# Patient Record
Sex: Female | Born: 2005 | Race: White | Hispanic: No | Marital: Single | Smoking: Never smoker
Health system: Southern US, Community
[De-identification: ages and names within clinical notes are randomized; demographics above are authoritative.]

## PROBLEM LIST (undated history)

## (undated) DIAGNOSIS — F419 Anxiety disorder, unspecified: Secondary | ICD-10-CM

## (undated) DIAGNOSIS — E282 Polycystic ovarian syndrome: Secondary | ICD-10-CM

## (undated) DIAGNOSIS — F32A Depression, unspecified: Secondary | ICD-10-CM

## (undated) DIAGNOSIS — M419 Scoliosis, unspecified: Secondary | ICD-10-CM

## (undated) DIAGNOSIS — K219 Gastro-esophageal reflux disease without esophagitis: Secondary | ICD-10-CM

## (undated) DIAGNOSIS — F909 Attention-deficit hyperactivity disorder, unspecified type: Secondary | ICD-10-CM

## (undated) HISTORY — DX: Polycystic ovarian syndrome: E28.2

---
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
## 2024-01-05 ENCOUNTER — Encounter (HOSPITAL_BASED_OUTPATIENT_CLINIC_OR_DEPARTMENT_OTHER): Payer: Self-pay

## 2024-01-05 ENCOUNTER — Other Ambulatory Visit: Payer: Self-pay

## 2024-01-05 ENCOUNTER — Emergency Department (HOSPITAL_BASED_OUTPATIENT_CLINIC_OR_DEPARTMENT_OTHER)

## 2024-01-05 ENCOUNTER — Emergency Department (HOSPITAL_BASED_OUTPATIENT_CLINIC_OR_DEPARTMENT_OTHER)
Admission: EM | Admit: 2024-01-05 | Discharge: 2024-01-05 | Disposition: A | Discharge status: Home / Self Care | Attending: Emergency Medicine | Admitting: Emergency Medicine

## 2024-01-05 DIAGNOSIS — R739 Hyperglycemia, unspecified: Secondary | ICD-10-CM | POA: Insufficient documentation

## 2024-01-05 DIAGNOSIS — R112 Nausea with vomiting, unspecified: Secondary | ICD-10-CM | POA: Insufficient documentation

## 2024-01-05 DIAGNOSIS — R1013 Epigastric pain: Secondary | ICD-10-CM

## 2024-01-05 LAB — COMPREHENSIVE METABOLIC PANEL WITH GFR
ALT: 13 U/L (ref 0–44)
AST: 20 U/L (ref 15–41)
Albumin: 4.6 g/dL (ref 3.5–5.0)
Alkaline Phosphatase: 134 U/L — ABNORMAL HIGH (ref 38–126)
Anion gap: 13 (ref 5–15)
BUN: 9 mg/dL (ref 6–20)
CO2: 24 mmol/L (ref 22–32)
Calcium: 10.6 mg/dL — ABNORMAL HIGH (ref 8.9–10.3)
Chloride: 102 mmol/L (ref 98–111)
Creatinine, Ser: 0.8 mg/dL (ref 0.44–1.00)
GFR, Estimated: >60 mL/min (ref >60)
Glucose, Bld: 92 mg/dL (ref 70–99)
Potassium: 4 mmol/L (ref 3.5–5.1)
Sodium: 139 mmol/L (ref 135–145)
Total Bilirubin: 0.4 mg/dL (ref 0.0–1.2)
Total Protein: 8.3 g/dL — ABNORMAL HIGH (ref 6.5–8.1)

## 2024-01-05 LAB — CBC
HCT: 44.5 % (ref 36.0–46.0)
Hemoglobin: 14.6 g/dL (ref 12.0–15.0)
MCH: 28.7 pg (ref 26.0–34.0)
MCHC: 32.8 g/dL (ref 30.0–36.0)
MCV: 87.4 fL (ref 80.0–100.0)
Platelets: 401 K/uL — ABNORMAL HIGH (ref 150–400)
RBC: 5.09 MIL/uL (ref 3.87–5.11)
RDW: 11.9 % (ref 11.5–15.5)
WBC: 9.7 K/uL (ref 4.0–10.5)
nRBC: 0 % (ref 0.0–0.2)

## 2024-01-05 LAB — URINALYSIS, ROUTINE W REFLEX MICROSCOPIC
Bilirubin Urine: NEGATIVE
Glucose, UA: NEGATIVE mg/dL
Hgb urine dipstick: NEGATIVE
Ketones, ur: NEGATIVE mg/dL
Nitrite: NEGATIVE
Protein, ur: NEGATIVE mg/dL
Specific Gravity, Urine: 1.005 (ref 1.005–1.030)
pH: 7 (ref 5.0–8.0)

## 2024-01-05 LAB — LIPASE, BLOOD: Lipase: 23 U/L (ref 11–51)

## 2024-01-05 LAB — PREGNANCY, URINE: Preg Test, Ur: NEGATIVE

## 2024-01-05 MED ORDER — IOHEXOL 300 MG/ML  SOLN
100.0000 mL | Freq: Once | INTRAMUSCULAR | Status: AC | PRN
  Administered 2024-01-05 (×2): 100 mL via INTRAVENOUS

## 2024-01-05 MED ORDER — PANTOPRAZOLE SODIUM 20 MG PO TBEC: 20.0000 mg | DELAYED_RELEASE_TABLET | Freq: Every day | ORAL | 1 refills | Status: DC

## 2024-01-05 MED ORDER — SUCRALFATE 1 G PO TABS: 1.0000 g | ORAL_TABLET | Freq: Three times a day (TID) | ORAL | 0 refills | Status: DC

## 2024-01-05 NOTE — Discharge Instructions (Signed)
 As discussed, your CT scan appeared normal.  Suspect your symptoms are likely coming from the stomach whether gastritis versus peptic ulcer disease.  Will treat your symptoms with PPI in the form of pantoprazole  as well as sucralfate  to use.  See lifestyle changes on your discharge instruction packet to help decrease likelihood of exacerbations.  Will send referral for GI to follow-up with.  Please not hesitate to return if the worrisome signs and symptoms discussed become apparent.

## 2024-01-05 NOTE — ED Triage Notes (Signed)
 Pt complaining of abdominal distention that started about 6 months ago. Recently she is only able to eat chicken broth. Hurts to wear underwear. Has been nauseated more and has vomited a couple of times.

## 2024-01-05 NOTE — ED Provider Notes (Addendum)
 [place] EMERGENCY DEPARTMENT AT [facility] Provider Note   CSN: [id number] Arrival date & time: 01/05/24  1417     Patient presents with: Abdominal Pain   Amy Cuevas is a 18 y.o. female.    Abdominal Pain   18 year old female presents emergency department with complaints of abdominal pain, nausea, vomiting, decreased bowel movements.  States that she has been dealing with symptoms intermittently for the past 6 months but symptoms have been more persistent over the past week or so.  Reports pain mainly in the upper middle abdomen that sometimes goes to her back and symptoms radiates to the rest of her abdomen.  States the pain mainly occurs when she eats, after she eats or when she lays down at night.  Has tried multiple over-the-counter reflux medications including Pepcid, omeprazole, Pepto-Bismol without significant improvement of symptoms.  Reports nausea and emesis when the pain is significant.  States that she typically has 1 bowel movement a week.  This has been the case for the past year or so with last bowel movement this morning.  Feels like her abdomen is swollen.  Denies any fevers, chills, urinary symptoms/melena, hematemesis.  Denies history abdominal surgeries.  Past medical history significant for PCOS, slow transit constipation, GAD, abnormal uterine bleeding Prior to Admission medications   Medication Sig Start Date End Date Taking? Authorizing Provider  escitalopram  (LEXAPRO ) 10 MG tablet Take 1.5 tablets (15 mg total) by mouth daily. 08/06/21   Viviana Aleck DASEN, FNP  hydrOXYzine (ATARAX/VISTARIL) 25 MG tablet Take 25 mg by mouth 3 (three) times daily as needed.    [provider]  lisdexamfetamine (VYVANSE ) 20 MG capsule Take 1 capsule (20 mg total) by mouth daily with breakfast. 11/05/21   Viviana Aleck DASEN, FNP  norelgestromin -ethinyl estradiol  (ZAFEMY ) 150-35 MCG/24HR transdermal patch PLACE 1 PATCH ONTO THE SKIN ONCE A WEEK 08/06/21   Viviana Aleck T, FNP  polyethylene glycol powder (GLYCOLAX /MIRALAX ) 17 GM/SCOOP powder Follow clean-out instructions and daily maintenance dose as discussed. 04/05/20   Joshua Bari HERO, NP  tretinoin  (RETIN-A ) 0.05 % cream Apply topically at bedtime. 10/15/21   Viviana Aleck DASEN, FNP  Vitamin D , Ergocalciferol , (DRISDOL ) 1.25 MG (50000 UNIT) CAPS capsule Take 1 capsule (50,000 Units total) by mouth every 7 (seven) days. 08/07/21   Viviana Aleck DASEN, FNP    Allergies: Patient has no known allergies.    Review of Systems  Gastrointestinal:  Positive for abdominal pain.    Updated Vital Signs BP 119/76 (BP Location: Right Arm)   Pulse 88   Temp 98.6 F (37 C) (Oral)   Resp 14   Ht 5' 4 (1.626 m)   Wt 81.6 kg   SpO2 100%   BMI 30.90 kg/m   Physical Exam Vitals and nursing note reviewed.  Constitutional:      General: She is not in acute distress.    Appearance: She is well-developed.  HENT:     Head: Normocephalic and atraumatic.  Eyes:     Conjunctiva/sclera: Conjunctivae normal.  Cardiovascular:     Rate and Rhythm: Normal rate and regular rhythm.     Heart sounds: No murmur heard. Pulmonary:     Effort: Pulmonary effort is normal. No respiratory distress.     Breath sounds: Normal breath sounds.  Abdominal:     Palpations: Abdomen is soft.     Tenderness: There is abdominal tenderness in the epigastric area.  Musculoskeletal:        General: No swelling.  Cervical back: Neck supple.  Skin:    General: Skin is warm and dry.     Capillary Refill: Capillary refill takes less than 2 seconds.  Neurological:     Mental Status: She is alert.  Psychiatric:        Mood and Affect: Mood normal.     (all labs ordered are listed, but only abnormal results are displayed) Labs Reviewed  COMPREHENSIVE METABOLIC PANEL WITH GFR - Abnormal; Notable for the following components:      Result Value   Calcium 10.6 (*)    Total Protein 8.3 (*)    Alkaline Phosphatase 134 (*)     All other components within normal limits  CBC - Abnormal; Notable for the following components:   Platelets 401 (*)    All other components within normal limits  URINALYSIS, ROUTINE W REFLEX MICROSCOPIC - Abnormal; Notable for the following components:   Color, Urine COLORLESS (*)    Leukocytes,Ua SMALL (*)    Bacteria, UA MANY (*)    All other components within normal limits  LIPASE, BLOOD  PREGNANCY, URINE    EKG: None  Radiology: No results found.   Procedures   Medications Ordered in the ED - No data to display  Clinical Course as of 01/05/24 1854  Mon Jan 05, 2024  1846 Abd pain x 6 mos now persistent x waiting for ct  [AH]    Clinical Course User Index [AH] Arloa Chroman, PA-C                                 Medical Decision Making Amount and/or Complexity of Data Reviewed Labs: ordered. Radiology: ordered.  Risk Prescription drug management.   This patient presents to the ED for concern of abdominal pain, this involves an extensive number of treatment options, and is a complaint that carries with it a high risk of complications and morbidity.  The differential diagnosis includes gastritis, PUD, cholecystitis, CBD pathology, SBO/LBO, volvulus, diverticulitis, appendicitis, cystitis, pyelonephritis nephrolithiasis, other   Co morbidities that complicate the patient evaluation  See HPI   Additional history obtained:  Additional history obtained from EMR External records from outside source obtained and reviewed including hospital records   Lab Tests:  I Ordered, and personally interpreted labs.  The pertinent results include: No leukocytosis.  No evidence of anemia.  Platelets slightly elevated at 41.  Mild hyperglycemia of 226, otherwise electrolytes within limits.  No transaminitis.  No renal dysfunction.  Lipase within limits.  Urine pregnancy negative.  UA with many bacteria, 11-20 WBCs with small leukocytes.   Imaging Studies ordered:  I  ordered imaging studies including CT abdomen pelvis I independently visualized and interpreted imaging which i appeared normal. I agree with the radiologist interpretation   Cardiac Monitoring: / EKG:  The patient was maintained on a cardiac monitor.  I personally viewed and interpreted the cardiac monitored which showed an underlying rhythm of: sinus rhythm   Consultations Obtained:  N/a   Problem List / ED Course / Critical interventions / Medication management  Upper abdominal pain Reevaluation of the patient showed that the patient stayed the same I have reviewed the patients home medicines and have made adjustments as needed   Social Determinants of Health:  Denies tobacco, licit drug use.   Test / Admission - Considered:  Upper abdominal pain Vitals signs within normal range and stable throughout visit. Laboratory/imaging studies significant for: see  above 18 year old female presents emergency department with complaints of abdominal pain, nausea, vomiting, decreased bowel movements.  States that she has been dealing with symptoms intermittently for the past 6 months but symptoms have been more persistent over the past week or so.  Reports pain mainly in the upper middle abdomen that sometimes goes to her back and symptoms radiates to the rest of her abdomen.  States the pain mainly occurs when she eats, after she eats or when she lays down at night.  Has tried multiple over-the-counter reflux medications including Pepcid, omeprazole, Pepto-Bismol without significant improvement of symptoms.  Reports nausea and emesis when the pain is significant.  States that she typically has 1 bowel movement a week.  This has been the case for the past year or so with last bowel movement this morning.  Feels like her abdomen is swollen.  Denies any fevers, chills, urinary symptoms/melena, hematemesis.  Denies history abdominal surgeries. On exam, most focal tenderness epigastric region.  Labs  reassuring without acute emergent process.  CT imaging negative for any acute abnormality.  Suspect gastritis versus PUD as cause of symptoms.  Will begin PPI as well as recommend lifestyle/dietary changes as described in AVS.  Follow-up PCP/GI recommended in the outpatient setting.  Treatment plan discussed with patient and she acknowledged and was agreeable to said plan.  Patient well-appearing, afebrile in no acute distress. Worrisome signs and symptoms were discussed with the patient and patient knowledge understanding to return to the emergency department if notice.  Patient stable for discharge.      Final diagnoses:  None    ED Discharge Orders     None           Silver Wonda LABOR, [place] 01/05/24 1910    Bari Roxie HERO, DO 01/07/24 2221

## 2024-01-09 NOTE — Progress Notes (Unsigned)
 01/12/2024 Amy Cuevas [id number] 2006/02/13  Referring provider: Cristopher Bottcher, NP Gwen) Primary GI doctor: Dr. 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 pylori, GERD, EOE -alginate therapy given -Lifestyle changes discussed, avoid NSAIDS, ETOH, hand out given to the patient -Weight loss discussed with the patient -Schedule EGD at [facility] to evaluate GERD, esophagitis, hiatal hernia,H pylori. I discussed risks of EGD with patient today, including risk of sedation, bleeding or perforation. Patient provides understanding and gave verbal consent to proceed. -Consider GES, gastroparesis diet given  Constipation with nausea, abdominal distention, tenesmus Acute onset 1 year ago, Worsening in last 2 weeks, BM once a week with pebbles  Fleets, suppositories, linzess, a prep without any help, tried to increase water, walking after eating CT abdomen pelvis unremarkable, potentially related to COVID infection about a year ago when symptoms started versus worsening IBS versus other -Add on Linzess 290 mcg after bowel purge -Consider Motegrity -After discussion we will plan on colonoscopy with EGD to evaluate for IBD, 2-day prep, We have discussed the risks of bleeding, infection, perforation, medication reactions, and remote risk of death associated with colonoscopy. All questions were answered and the patient acknowledges these risk and wishes to proceed.  Elevated alkaline  phosphatase 01/05/2024 CTAP W normal liver normal gallbladder normal bile ducts normal pancreas normal spleen 05/14/2023 alk phos 119, AST 27 ALT 28, total bilirubin 0.2 01/05/2024 alk phos 134, AST 20, ALT 13, total bilirubin 0.4 Calcium 10.6 total protein 8.3, albumin 4.6 previous vitamin D  deficiency - Will recheck alk phos, calcium, add on GGT, vitamin D   PCOS  Patient Care Team: Cristopher Bottcher, NP as PCP - General (Family Medicine)  HISTORY OF PRESENT ILLNESS: 18 y.o. female with a past medical history listed below presents for evaluation of peptic ulcer.   Discussed the use of AI scribe software for clinical note transcription with the patient, who gave verbal consent to proceed.  History of Present Illness   Amy Cuevas is a 18 year old female who presents with chronic abdominal pain, nausea, vomiting, and constipation. She is accompanied by her father.  She has experienced chronic abdominal pain, nausea, and vomiting for approximately one year, with symptoms becoming continuous over the past week and a half. The pain is primarily epigastric but can radiate to the lower abdomen and chest. Severe constipation is present, with bowel movements occurring less than once a week, and stools described as 'pebbles'.  She has tried various treatments for constipation, including Linzess, Miralax , magnesium citrate, Fleet suppositories, and other over-the-counter remedies, but none have been effective. She recalls taking a low dose of Linzess (72 mcg) but has not used it in two weeks.  Significant indigestion and acid reflux occur, particularly when lying down at night, despite eating several hours before bedtime. She describes a sensation of stomach distension and bloating after eating, which is not relieved by changing positions. Her diet has been restricted to bland foods such as chicken broth, applesauce, and plain soups to avoid exacerbating her symptoms. Liquids, particularly  water, are generally tolerated better than solid foods.  No weight loss or rashes or joint pain are reported. She does experience chills and body shakes when severely constipated. She has occasional seasonal allergies but no history of asthma or eczema. She has experienced difficulty swallowing, particularly with larger pills, and recently had tonsillitis, which may have contributed to a sensation of a smaller throat.  Her family history is significant for gastrointestinal and autoimmune conditions. Her mother has had a large portion of her intestine removed due to diverticulitis and colitis. Autoimmune diseases, including Sjogren's syndrome and potential multiple sclerosis, are present in her family. Her maternal aunt is undergoing testing for Crohn's disease.  She does not consume alcohol, smoke, or use drugs.      She  reports that she has never smoked. She has never used smokeless tobacco. She reports that she does not drink alcohol and does not use drugs.  RELEVANT GI HISTORY, IMAGING AND LABS: Results   LABS Calcium: Elevated Alkaline phosphatase: Elevated  RADIOLOGY CT abdomen pelvis: Normal bowel, normal stomach, no wall thickening (01/05/2024)      CBC    Component Value Date/Time   WBC 9.7 01/05/2024 1448   RBC 5.09 01/05/2024 1448   HGB 14.6 01/05/2024 1448   HCT 44.5 01/05/2024 1448   PLT 401 (H) 01/05/2024 1448   MCV 87.4 01/05/2024 1448   MCH 28.7 01/05/2024 1448   MCHC 32.8 01/05/2024 1448   RDW 11.9 01/05/2024 1448   LYMPHSABS 3.9 12/28/2012 2028   MONOABS 0.7 12/28/2012 2028   EOSABS 0.4 12/28/2012 2028   BASOSABS 0.0 12/28/2012 2028   Recent Labs    01/05/24 1448  HGB 14.6    CMP     Component Value Date/Time   NA 139 01/05/2024 1448   K 4.0 01/05/2024 1448   CL 102 01/05/2024 1448   CO2 24 01/05/2024 1448   GLUCOSE 92 01/05/2024 1448   BUN 9 01/05/2024 1448   CREATININE 0.80 01/05/2024 1448   CREATININE 0.91 08/06/2021 1441   CALCIUM 10.6 (H)  01/05/2024 1448   PROT 8.3 (H) 01/05/2024 1448   ALBUMIN 4.6 01/05/2024 1448   AST 20 01/05/2024 1448   ALT 13 01/05/2024 1448   ALKPHOS 134 (H) 01/05/2024 1448   BILITOT 0.4 01/05/2024 1448   GFRNONAA >60 01/05/2024 1448   GFRAA NOT CALCULATED 12/28/2012 2028      Latest Ref Rng & Units 01/05/2024    2:48 PM 08/06/2021    2:41 PM 12/28/2012    8:28 PM  Hepatic Function  Total Protein 6.5 - 8.1 g/dL 8.3  7.1  7.1   Albumin 3.5 - 5.0 g/dL 4.6   4.0   AST 15 - 41 U/L 20  19  26    ALT 0 - 44 U/L 13  23  17    Alk Phosphatase 38 - 126 U/L 134   224   Total Bilirubin 0.0 - 1.2 mg/dL 0.4  0.4  0.1       Current Medications:    Current Outpatient Medications (Cardiovascular):    spironolactone (ALDACTONE) 50 MG tablet, Take 100 mg by mouth daily.     Current Outpatient Medications (Other):    escitalopram  (LEXAPRO ) 10 MG tablet, Take 1.5 tablets (15 mg total) by mouth daily.   hydrOXYzine (ATARAX/VISTARIL) 25 MG tablet, Take 25 mg by mouth 3 (three) times daily as needed.   Na Sulfate-K Sulfate-Mg Sulfate concentrate (SUPREP) 17.5-3.13-1.6 GM/177ML SOLN, Take 1 kit (354 mLs total) by  mouth once for 1 dose.   pantoprazole  (PROTONIX ) 20 MG tablet, Take 1 tablet (20 mg total) by mouth daily.   sucralfate  (CARAFATE ) 1 g tablet, Take 1 tablet (1 g total) by mouth 4 (four) times daily -  with meals and at bedtime.   tretinoin  (RETIN-A ) 0.05 % cream, Apply topically at bedtime.   polyethylene glycol powder (GLYCOLAX /MIRALAX ) 17 GM/SCOOP powder, Follow clean-out instructions and daily maintenance dose as discussed. (Patient not taking: Reported on 01/12/2024)   Vitamin D , Ergocalciferol , (DRISDOL ) 1.25 MG (50000 UNIT) CAPS capsule, Take 1 capsule (50,000 Units total) by mouth every 7 (seven) days. (Patient not taking: Reported on 01/12/2024)  Medical History:  Past Medical History:  Diagnosis Date   ADHD (attention deficit hyperactivity disorder)    Anxiety    Anxiety disorder     Scoliosis    Allergies: No Known Allergies   Surgical History:  She  has no past surgical history on file. Family History:  Her family history includes Cystic fibrosis in her brother; Diabetes type I in her paternal grandfather; Diabetes type II in her maternal grandmother and paternal grandmother; Endometriosis in her mother; Fibromyalgia in her maternal aunt; Hyperlipidemia in her father; Hypertension in her maternal grandmother and paternal grandmother; Irritable bowel syndrome in her maternal grandmother; Multiple sclerosis in her maternal aunt; Polycystic ovary syndrome in her mother.  REVIEW OF SYSTEMS  : All other systems reviewed and negative except where noted in the History of Present Illness.  PHYSICAL EXAM: BP 110/72   Pulse 78   Ht 5' 5.35 (1.66 m) Comment: height without shoes  Wt 182 lb 4 oz (82.7 kg)   LMP  (LMP Unknown)   BMI 30.00 kg/m  Physical Exam   GENERAL APPEARANCE: Well nourished, in no apparent distress. HEENT: No cervical lymphadenopathy, unremarkable thyroid, sclerae anicteric, conjunctiva pink. RESPIRATORY: Respiratory effort normal, breath sounds equal bilaterally without rales, rhonchi, or wheezing. CARDIO: Regular rate and rhythm with no murmurs, rubs, or gallops, peripheral pulses intact. ABDOMEN: Soft, non-distended, active bowel sounds in all four quadrants, no tenderness to palpation, no rebound, no mass appreciated. RECTAL: Declines. MUSCULOSKELETAL: Full range of motion, normal gait, without edema. SKIN: Dry, intact without rashes or lesions. No jaundice. NEURO: Alert, oriented, no focal deficits. PSYCH: Cooperative, normal mood and affect.      Alan JONELLE Coombs, PA-C 11:59 AM

## 2024-01-12 ENCOUNTER — Other Ambulatory Visit

## 2024-01-12 ENCOUNTER — Other Ambulatory Visit (INDEPENDENT_AMBULATORY_CARE_PROVIDER_SITE_OTHER)

## 2024-01-12 ENCOUNTER — Ambulatory Visit: Payer: Self-pay | Admitting: Physician Assistant

## 2024-01-12 ENCOUNTER — Ambulatory Visit: Admitting: Physician Assistant

## 2024-01-12 ENCOUNTER — Ambulatory Visit (INDEPENDENT_AMBULATORY_CARE_PROVIDER_SITE_OTHER)
Admission: RE | Admit: 2024-01-12 | Discharge: 2024-01-12 | Disposition: A | Discharge status: Home / Self Care | Source: Ambulatory Visit | Attending: Physician Assistant | Admitting: Physician Assistant

## 2024-01-12 ENCOUNTER — Encounter: Payer: Self-pay | Admitting: Physician Assistant

## 2024-01-12 VITALS — BP 110/72 | HR 78 | Ht 65.35 in | Wt 182.2 lb

## 2024-01-12 DIAGNOSIS — R198 Other specified symptoms and signs involving the digestive system and abdomen: Secondary | ICD-10-CM

## 2024-01-12 DIAGNOSIS — R1013 Epigastric pain: Secondary | ICD-10-CM

## 2024-01-12 DIAGNOSIS — R7982 Elevated C-reactive protein (CRP): Secondary | ICD-10-CM

## 2024-01-12 DIAGNOSIS — R112 Nausea with vomiting, unspecified: Secondary | ICD-10-CM

## 2024-01-12 DIAGNOSIS — K5904 Chronic idiopathic constipation: Secondary | ICD-10-CM

## 2024-01-12 DIAGNOSIS — R748 Abnormal levels of other serum enzymes: Secondary | ICD-10-CM | POA: Diagnosis not present

## 2024-01-12 DIAGNOSIS — R140 Abdominal distension (gaseous): Secondary | ICD-10-CM | POA: Diagnosis not present

## 2024-01-12 DIAGNOSIS — K5900 Constipation, unspecified: Secondary | ICD-10-CM

## 2024-01-12 DIAGNOSIS — E559 Vitamin D deficiency, unspecified: Secondary | ICD-10-CM

## 2024-01-12 LAB — CBC WITH DIFFERENTIAL/PLATELET
Basophils Absolute: 0 K/uL (ref 0.0–0.1)
Basophils Relative: 0.8 % (ref 0.0–3.0)
Eosinophils Absolute: 0.1 K/uL (ref 0.0–0.7)
Eosinophils Relative: 2.4 % (ref 0.0–5.0)
HCT: 38.8 % (ref 36.0–49.0)
Hemoglobin: 13.1 g/dL (ref 12.0–16.0)
Lymphocytes Relative: 32.5 % (ref 24.0–48.0)
Lymphs Abs: 2 K/uL (ref 0.7–4.0)
MCHC: 33.7 g/dL (ref 31.0–37.0)
MCV: 84.9 fl (ref 78.0–98.0)
Monocytes Absolute: 0.4 K/uL (ref 0.1–1.0)
Monocytes Relative: 6.5 % (ref 3.0–12.0)
Neutro Abs: 3.6 K/uL (ref 1.4–7.7)
Neutrophils Relative %: 57.8 % (ref 43.0–71.0)
Platelets: 396 K/uL (ref 150.0–575.0)
RBC: 4.56 Mil/uL (ref 3.80–5.70)
RDW: 12.5 % (ref 11.4–15.5)
WBC: 6.1 K/uL (ref 4.5–13.5)

## 2024-01-12 LAB — COMPREHENSIVE METABOLIC PANEL WITH GFR
ALT: 11 U/L (ref 0–35)
AST: 14 U/L (ref 0–37)
Albumin: 4.2 g/dL (ref 3.5–5.2)
Alkaline Phosphatase: 98 U/L (ref 47–119)
BUN: 8 mg/dL (ref 6–23)
CO2: 29 meq/L (ref 19–32)
Calcium: 9.3 mg/dL (ref 8.4–10.5)
Chloride: 99 meq/L (ref 96–112)
Creatinine, Ser: 0.77 mg/dL (ref 0.40–1.20)
GFR: 111.46 mL/min (ref >60.00)
Glucose, Bld: 96 mg/dL (ref 70–99)
Potassium: 3.7 meq/L (ref 3.5–5.1)
Sodium: 136 meq/L (ref 135–145)
Total Bilirubin: 0.4 mg/dL (ref 0.2–1.2)
Total Protein: 7.3 g/dL (ref 6.0–8.3)

## 2024-01-12 LAB — TSH: TSH: 2.34 u[IU]/mL (ref 0.40–5.00)

## 2024-01-12 LAB — SEDIMENTATION RATE: Sed Rate: 33 mm/h — ABNORMAL HIGH (ref 0–20)

## 2024-01-12 LAB — HIGH SENSITIVITY CRP: CRP, High Sensitivity: 5.18 mg/L — ABNORMAL HIGH (ref 0.000–5.000)

## 2024-01-12 LAB — GAMMA GT: GGT: 11 U/L (ref 7–51)

## 2024-01-12 MED ORDER — NA SULFATE-K SULFATE-MG SULF 17.5-3.13-1.6 GM/177ML PO SOLN: 1.0000 | Freq: Once | ORAL | 0 refills | Status: AC

## 2024-01-12 NOTE — Patient Instructions (Addendum)
 Your provider has requested that you go to the basement level for lab work before leaving today. Press B on the elevator. The lab is located at the first door on the left as you exit the elevator.  Your provider has requested that you have an abdominal x ray before leaving today. Please go to the basement floor to our Radiology department for the test.  Please take your proton pump inhibitor medication, pantoprazole  20 mg once a day, Please take this medication 30 minutes to 1 hour before meals- this makes it more effective.  Avoid spicy and acidic foods Avoid fatty foods Limit your intake of coffee, tea, alcohol, and carbonated drinks Work to maintain a healthy weight Keep the head of the bed elevated at least 3 inches with blocks or a wedge pillow if you are having any nighttime symptoms Stay upright for 2 hours after eating Avoid meals and snacks three to four hours before bedtime   Reflux Gourmet Rescue  It is an ALGINATE THERAPY which is the only intervention that works to safeguard the esophagus by creating a protective barrier that actually stops reflux from happening. -The general directions for use are as stated on the packaging: Take 1 teaspoon (5 ml), or more as needed or as directed by your physician, after meals and before bed. -These general directions address the most common times for reflux to occur, but our Rescue products may be taken anytime. Some individuals may take our product preemptively, when they know they will suffer from reflux, or as needed - when discomfort arises. (If taken around food, it should be consumed last.) -You do not have to take 1 teaspoon (5 ml) of the product. While one teaspoon (5ml) may be the perfect average amount to relieve reflux suffering in some, others may require more or less. You may adjust the amount of Mint Chocolate Rescue and Vanilla Caramel Rescue to the lowest amount necessary to meet your individual needs to improve your quality of  life. -You may dilute the product if it is too viscous for you to consume. Keep in mind, however, that the thickness of the product was formulated to provide optimal coating and protection of your throat and esophagus. Though diluting the product is possible, it may reduce the protective function and/or length of action. -This can be used in conjunction with reflux medications and lifestyle changes.  100% ALL-NATURAL  Paraben FREE, glycerin FREE, & potassium FREE  Made entirely from all-natural ingredients considered safe for children and during pregnancy  No known side effects  All-natural flavor Gluten FREE  Allergen FREE  Vegan  Can find more information here: NameSeizer.co.nz  Gastroparesis Gastroparesis is a condition in which food takes longer than normal to empty from the stomach.  This condition is also known as delayed gastric emptying. It is usually a long-term (chronic) condition.  What are the signs or symptoms? Symptoms of this condition include: Feeling full after eating very little or a loss of appetite. Nausea, vomiting, or heartburn. Bloating of your abdomen. Inconsistent blood sugar (glucose) levels on blood tests. Unexplained weight loss. Acid from the stomach coming up into the esophagus (gastroesophageal reflux). Sudden tightening (spasm) of the stomach, which can be painful. Symptoms may come and go. Some people may not notice any symptoms.  What increases the risk? You are more likely to develop this condition if: You have certain disorders or diseases. These may include: An endocrine disorder. An eating disorder. Amyloidosis. Scleroderma. Parkinson's disease. Multiple sclerosis. Cancer or  infection of the stomach or the vagus nerve. You have had surgery on your stomach or vagus nerve. You take certain medicines. You are female.  Things you can do: Please do small frequent meals like 4-6 meals a day.  Eat and  drink liquids at separate times.  Avoid high fiber foods, cook your vegetables, avoid high fat food.  Suggest spreading protein throughout the day (greek yogurt, glucerna, soft meat, milk, eggs) Choose soft foods that you can mash with a fork When you are more symptomatic, change to pureed foods foods and liquids.  Consider reading Living well with Gastroparesis by [facility] Check out this link to a diet online https://my.GroupJournal.fr  Please do the following: Purchase a bottle of Miralax  over the counter as well as a box of 5 mg dulcolax tablets. Take 4 dulcolax tablets. Wait 1 hour. You will then drink 6-8 capfuls of Miralax  mixed in an adequate amount of water/juice/gatorade (you may choose which of these liquids to drink) over the next 2-3 hours. You should expect results within 1 to 6 hours after completing the bowel purge. Go to the er if you have severe AB pain, can not pass gas or stool in over 12 hours, can not hold down any food.    Linzess 290 mcg *IBS-C patients may begin to experience relief from belly pain and overall abdominal symptoms (pain, discomfort, and bloating) in about 1 week,  with symptoms typically improving over 12 weeks.  Take at least 30 minutes before the first meal of the day on an empty stomach You can have a loose stool if you eat a high-fat breakfast. Give it at least 7 days, may have more bowel movements during that time.   The diarrhea should go away and you should start having normal, complete, full bowel movements.  It may be helpful to start treatment when you can be near the comfort of your own bathroom, such as a weekend.  After you are out we can send in a prescription if you did well, there is a prescription card  Toileting tips to help with your constipation - Drink at least 64-80 ounces of water/liquid per day. - Establish a time to try to  move your bowels every day.  For many people, this is after a cup of coffee or after a meal such as breakfast. - Sit all of the way back on the toilet keeping your back fairly straight and while sitting up, try to rest the tops of your forearms on your upper thighs.   - Raising your feet with a step stool/squatty potty can be helpful to improve the angle that allows your stool to pass through the rectum. - Relax the rectum feeling it bulge toward the toilet water.  If you feel your rectum raising toward your body, you are contracting rather than relaxing. - Breathe in and slowly exhale. Belly breath by expanding your belly towards your belly button. Keep belly expanded as you gently direct pressure down and back to the anus.  A low pitched GRRR sound can assist with increasing intra-abdominal pressure.  (Can also trying to blow on a pinwheel and make it move, this helps with the same belly breathing) - Repeat 3-4 times. If unsuccessful, contract the pelvic floor to restore normal tone and get off the toilet.  Avoid excessive straining. - To reduce excessive wiping by teaching your anus to normally contract, place hands on outer aspect of knees and resist knee movement outward.  Hold 5-10  second then place hands just inside of knees and resist inward movement of knees.  Hold 5 seconds.  Repeat a few times each way.  Go to the ER if unable to pass gas, severe AB pain, unable to hold down food, any shortness of breath of chest pain.   You have been scheduled for an endoscopy and colonoscopy. Please follow the written instructions given to you at your visit today.  If you use inhalers (even only as needed), please bring them with you on the day of your procedure.  DO NOT TAKE 7 DAYS PRIOR TO TEST- Trulicity (dulaglutide) Ozempic, Wegovy (semaglutide) Mounjaro (tirzepatide) Bydureon Bcise (exanatide extended release)  DO NOT TAKE 1 DAY PRIOR TO YOUR TEST Rybelsus (semaglutide) Adlyxin  (lixisenatide) Victoza (liraglutide) Byetta (exanatide) ___________________________________________________________________________  _______________________________________________________  If your blood pressure at your visit was 140/90 or greater, please contact your primary care physician to follow up on this.  _______________________________________________________  If you are age 20 or older, your body mass index should be between 23-30. Your Body mass index is 30 kg/m. If this is out of the aforementioned range listed, please consider follow up with your Primary Care Provider.  If you are age 2 or younger, your body mass index should be between 19-25. Your Body mass index is 30 kg/m. If this is out of the aformentioned range listed, please consider follow up with your Primary Care Provider.   ________________________________________________________  The [facility] providers would like to encourage you to use MYCHART to communicate with providers for non-urgent requests or questions.  Due to long hold times on the telephone, sending your provider a message by [facility] may be a faster and more efficient way to get a response.  Please allow 48 business hours for a response.  Please remember that this is for non-urgent requests.  _______________________________________________________  [facility] is using a team-based approach to care.  Your team is made up of your doctor and two to three APPS. Our APPS (Nurse Practitioners and Physician Assistants) work with your physician to ensure care continuity for you. They are fully qualified to address your health concerns and develop a treatment plan. They communicate directly with your gastroenterologist to care for you. Seeing the Advanced Practice Practitioners on your physician's team can help you by facilitating care more promptly, often allowing for earlier appointments, access to diagnostic testing, procedures, and other  specialty referrals.

## 2024-01-12 NOTE — Progress Notes (Signed)
 I agree with the assessment and plan as outlined by Ms. Craig.

## 2024-01-14 ENCOUNTER — Ambulatory Visit: Payer: Self-pay | Admitting: Physician Assistant

## 2024-01-14 DIAGNOSIS — R7982 Elevated C-reactive protein (CRP): Secondary | ICD-10-CM

## 2024-01-14 LAB — CALPROTECTIN, FECAL: Calprotectin, Fecal: 21 ug/g (ref 0–120)

## 2024-01-16 LAB — VITAMIN D 1,25 DIHYDROXY
Vitamin D 1, 25 (OH)2 Total: 37 pg/mL (ref 18–72)
Vitamin D2 1, 25 (OH)2: <8 pg/mL
Vitamin D3 1, 25 (OH)2: 37 pg/mL

## 2024-01-16 LAB — IGA: Immunoglobulin A: 209 mg/dL (ref 47–310)

## 2024-01-16 LAB — TISSUE TRANSGLUTAMINASE, IGA: (tTG) Ab, IgA: <1 U/mL

## 2024-01-29 ENCOUNTER — Encounter: Payer: Self-pay | Admitting: Internal Medicine

## 2024-01-29 ENCOUNTER — Ambulatory Visit (AMBULATORY_SURGERY_CENTER): Admitting: Internal Medicine

## 2024-01-29 VITALS — BP 114/68 | HR 74 | Temp 98.7°F | Resp 19 | Ht 65.0 in | Wt 182.0 lb

## 2024-01-29 DIAGNOSIS — K648 Other hemorrhoids: Secondary | ICD-10-CM

## 2024-01-29 DIAGNOSIS — K5900 Constipation, unspecified: Secondary | ICD-10-CM | POA: Diagnosis not present

## 2024-01-29 DIAGNOSIS — T182XXA Foreign body in stomach, initial encounter: Secondary | ICD-10-CM | POA: Diagnosis not present

## 2024-01-29 DIAGNOSIS — K3189 Other diseases of stomach and duodenum: Secondary | ICD-10-CM

## 2024-01-29 DIAGNOSIS — Z3202 Encounter for pregnancy test, result negative: Secondary | ICD-10-CM

## 2024-01-29 DIAGNOSIS — K2950 Unspecified chronic gastritis without bleeding: Secondary | ICD-10-CM

## 2024-01-29 DIAGNOSIS — K200 Eosinophilic esophagitis: Secondary | ICD-10-CM

## 2024-01-29 DIAGNOSIS — R112 Nausea with vomiting, unspecified: Secondary | ICD-10-CM

## 2024-01-29 DIAGNOSIS — K5904 Chronic idiopathic constipation: Secondary | ICD-10-CM

## 2024-01-29 DIAGNOSIS — K222 Esophageal obstruction: Secondary | ICD-10-CM | POA: Diagnosis not present

## 2024-01-29 DIAGNOSIS — R1013 Epigastric pain: Secondary | ICD-10-CM

## 2024-01-29 DIAGNOSIS — K209 Esophagitis, unspecified without bleeding: Secondary | ICD-10-CM | POA: Diagnosis not present

## 2024-01-29 DIAGNOSIS — R7982 Elevated C-reactive protein (CRP): Secondary | ICD-10-CM

## 2024-01-29 LAB — POCT URINE PREGNANCY

## 2024-01-29 MED ORDER — SODIUM CHLORIDE 0.9 % IV SOLN: 500.0000 mL | Freq: Once | INTRAVENOUS | Status: DC

## 2024-01-29 MED ORDER — PANTOPRAZOLE SODIUM 40 MG PO TBEC: DELAYED_RELEASE_TABLET | ORAL | 3 refills | Status: DC

## 2024-01-29 NOTE — Progress Notes (Signed)
 Report to PACU, RN, vss, BBS= Clear.

## 2024-01-29 NOTE — Progress Notes (Signed)
 Called to room to assist during endoscopic procedure.  Patient ID and intended procedure confirmed with present staff. Received instructions for my participation in the procedure from the performing physician.

## 2024-01-29 NOTE — Op Note (Signed)
 [facility] Patient Name: Amy Cuevas Procedure Date: 01/29/2024 2:48 PM MRN: [id number] Endoscopist: Rosario Estefana Kidney , , [id number] Age: 18 Referring MD:  Date of Birth: 2006-04-13 Gender: Female Account #: [id number] Procedure:                Colonoscopy Indications:              Constipation, elevated CRP Medicines:                Monitored Anesthesia Care Procedure:                Pre-Anesthesia Assessment:                           - Prior to the procedure, a History and Physical                            was performed, and patient medications and                            allergies were reviewed. The patient's tolerance of                            previous anesthesia was also reviewed. The risks                            and benefits of the procedure and the sedation                            options and risks were discussed with the patient.                            All questions were answered, and informed consent                            was obtained. Prior Anticoagulants: The patient has                            taken no anticoagulant or antiplatelet agents. ASA                            Grade Assessment: II - A patient with mild systemic                            disease. After reviewing the risks and benefits,                            the patient was deemed in satisfactory condition to                            undergo the procedure.                           After obtaining informed consent, the colonoscope  was passed under direct vision. Throughout the                            procedure, the patient's blood pressure, pulse, and                            oxygen saturations were monitored continuously. The                            Olympus Scope SN: [id number] was introduced through                            the anus and advanced to the the terminal ileum.                            The colonoscopy was  performed without difficulty.                            The patient tolerated the procedure well. The                            quality of the bowel preparation was excellent. The                            terminal ileum, ileocecal valve, appendiceal                            orifice, and rectum were photographed. Scope In: 3:06:40 PM Scope Out: 3:18:03 PM Scope Withdrawal Time: 0 hours 7 minutes 36 seconds  Total Procedure Duration: 0 hours 11 minutes 23 seconds  Findings:                 The terminal ileum appeared normal.                           Non-bleeding internal hemorrhoids were found during                            retroflexion. Complications:            No immediate complications. Estimated Blood Loss:     Estimated blood loss: none. Impression:               - The examined portion of the ileum was normal.                           - Non-bleeding internal hemorrhoids.                           - No specimens collected. Recommendation:           - Discharge patient to home (with escort).                           - Repeat colonoscopy for screening purposes at age  43.                           - The findings and recommendations were discussed                            with the patient. Dr Estefana Federico Rosario Estefana Federico,  01/29/2024 3:25:48 PM

## 2024-01-29 NOTE — Patient Instructions (Signed)
 Resume previous diet. Continue present medications. Increase pantoprazole  40 mg to twice a day for 10 weeks, then every day.  Repeat colonoscopy for screening purposes at age 18.  YOU HAD AN ENDOSCOPIC PROCEDURE TODAY AT THE  ENDOSCOPY CENTER:   Refer to the procedure report that was given to you for any specific questions about what was found during the examination.  If the procedure report does not answer your questions, please call your gastroenterologist to clarify.  If you requested that your care partner not be given the details of your procedure findings, then the procedure report has been included in a sealed envelope for you to review at your convenience later.  YOU SHOULD EXPECT: Some feelings of bloating in the abdomen. Passage of more gas than usual.  Walking can help get rid of the air that was put into your GI tract during the procedure and reduce the bloating. If you had a lower endoscopy (such as a colonoscopy or flexible sigmoidoscopy) you may notice spotting of blood in your stool or on the toilet paper. If you underwent a bowel prep for your procedure, you may not have a normal bowel movement for a few days.  Please Note:  You might notice some irritation and congestion in your nose or some drainage.  This is from the oxygen used during your procedure.  There is no need for concern and it should clear up in a day or so.  SYMPTOMS TO REPORT IMMEDIATELY:  Following lower endoscopy (colonoscopy or flexible sigmoidoscopy):  Excessive amounts of blood in the stool  Significant tenderness or worsening of abdominal pains  Swelling of the abdomen that is new, acute  Fever of 100F or higher  Following upper endoscopy (EGD)  Vomiting of blood or coffee ground material  New chest pain or pain under the shoulder blades  Painful or persistently difficult swallowing  New shortness of breath  Fever of 100F or higher  Black, tarry-looking stools  For urgent or emergent issues,  a gastroenterologist can be reached at any hour by calling [phone]. Do not use MyChart messaging for urgent concerns.    DIET:  We do recommend a small meal at first, but then you may proceed to your regular diet.  Drink plenty of fluids but you should avoid alcoholic beverages for 24 hours.  ACTIVITY:  You should plan to take it easy for the rest of today and you should NOT DRIVE or use heavy machinery until tomorrow (because of the sedation medicines used during the test).    FOLLOW UP: Our staff will call the number listed on your records the next business day following your procedure.  We will call around 7:15- 8:00 am to check on you and address any questions or concerns that you may have regarding the information given to you following your procedure. If we do not reach you, we will leave a message.     If any biopsies were taken you will be contacted by phone or by letter within the next 1-3 weeks.  Please call us  at [phone] if you have not heard about the biopsies in 3 weeks.    SIGNATURES/CONFIDENTIALITY: You and/or your care partner have signed paperwork which will be entered into your electronic medical record.  These signatures attest to the fact that that the information above on your After Visit Summary has been reviewed and is understood.  Full responsibility of the confidentiality of this discharge information lies with you and/or your care-partner.

## 2024-01-29 NOTE — Progress Notes (Signed)
 GASTROENTEROLOGY PROCEDURE H&P NOTE   Primary Care Physician: Samie Frederick, PA-C    Reason for Procedure:   N&V, epigastric ab pain, elevated CRP  Plan:    EGD/colonoscopy  Patient is appropriate for endoscopic procedure(s) in the ambulatory ([facility]) setting.  The nature of the procedure, as well as the risks, benefits, and alternatives were carefully and thoroughly reviewed with the patient. Ample time for discussion and questions allowed. The patient understood, was satisfied, and agreed to proceed.     HPI: Amy Cuevas is a 18 y.o. female who presents for EGD/colonoscopy for evaluation of N&V, epigastric ab pain, elevated CRP.  Patient was most recently seen in the Gastroenterology Clinic on 01/12/24.  No interval change in medical history since that appointment. Please refer to that note for full details regarding GI history and clinical presentation.   Past Medical History:  Diagnosis Date   ADHD (attention deficit hyperactivity disorder)    Anxiety    Anxiety disorder    PCOS (polycystic ovarian syndrome)    Scoliosis     History reviewed. No pertinent surgical history.  Prior to Admission medications   Medication Sig Start Date End Date Taking? Authorizing Provider  escitalopram  (LEXAPRO ) 10 MG tablet Take 1.5 tablets (15 mg total) by mouth daily. 08/06/21   Viviana Aleck DASEN, FNP  hydrOXYzine (ATARAX/VISTARIL) 25 MG tablet Take 25 mg by mouth 3 (three) times daily as needed.    [provider]  pantoprazole  (PROTONIX ) 20 MG tablet Take 1 tablet (20 mg total) by mouth daily. 01/05/24   Silver Fell A, PA  polyethylene glycol powder (GLYCOLAX /MIRALAX ) 17 GM/SCOOP powder Follow clean-out instructions and daily maintenance dose as discussed. Patient not taking: Reported on 01/12/2024 04/05/20   Joshua Bari HERO, NP  spironolactone (ALDACTONE) 50 MG tablet Take 100 mg by mouth daily.    [provider]  sucralfate  (CARAFATE ) 1 g tablet Take 1 tablet  (1 g total) by mouth 4 (four) times daily -  with meals and at bedtime. 01/05/24 02/04/24  Silver Fell LABOR, PA  tretinoin  (RETIN-A ) 0.05 % cream Apply topically at bedtime. 10/15/21   Viviana Aleck DASEN, FNP  Vitamin D , Ergocalciferol , (DRISDOL ) 1.25 MG (50000 UNIT) CAPS capsule Take 1 capsule (50,000 Units total) by mouth every 7 (seven) days. Patient not taking: Reported on 01/12/2024 08/07/21   Viviana Aleck DASEN, FNP    Current Outpatient Medications  Medication Sig Dispense Refill   escitalopram  (LEXAPRO ) 10 MG tablet Take 1.5 tablets (15 mg total) by mouth daily. 135 tablet 1   hydrOXYzine (ATARAX/VISTARIL) 25 MG tablet Take 25 mg by mouth 3 (three) times daily as needed.     pantoprazole  (PROTONIX ) 20 MG tablet Take 1 tablet (20 mg total) by mouth daily. 30 tablet 1   spironolactone (ALDACTONE) 50 MG tablet Take 100 mg by mouth daily.     sucralfate  (CARAFATE ) 1 g tablet Take 1 tablet (1 g total) by mouth 4 (four) times daily -  with meals and at bedtime. 120 tablet 0   tretinoin  (RETIN-A ) 0.05 % cream Apply topically at bedtime. 45 g 3   Vitamin D , Ergocalciferol , (DRISDOL ) 1.25 MG (50000 UNIT) CAPS capsule Take 1 capsule (50,000 Units total) by mouth every 7 (seven) days. (Patient not taking: Reported on 01/12/2024) 12 capsule 0   Current Facility-Administered Medications  Medication Dose Route Frequency Provider Last Rate Last Admin   0.9 %  sodium chloride  infusion  500 mL Intravenous Once Reynol Arnone C, MD  Allergies as of 01/29/2024   (No Known Allergies)    Family History  Problem Relation Age of Onset   Polycystic ovary syndrome Mother    Endometriosis Mother    Hyperlipidemia Father    Cystic fibrosis Brother    Fibromyalgia Maternal Aunt    Multiple sclerosis Maternal Aunt    Diabetes type II Maternal Grandmother    Hypertension Maternal Grandmother    Irritable bowel syndrome Maternal Grandmother    Diabetes type II Paternal Grandmother    Hypertension  Paternal Grandmother    Diabetes type I Paternal Grandfather    Colon cancer Neg Hx    Esophageal cancer Neg Hx    Rectal cancer Neg Hx    Stomach cancer Neg Hx     Social History   Socioeconomic History   Marital status: Single    Spouse name: Not on file   Number of children: Not on file   Years of education: Not on file   Highest education level: Not on file  Occupational History   Not on file  Tobacco Use   Smoking status: Never   Smokeless tobacco: Never  Vaping Use   Vaping status: Never Used  Substance and Sexual Activity   Alcohol use: No   Drug use: Never   Sexual activity: Not on file  Other Topics Concern   Not on file  Social History Narrative   Lives with mom, dad, and Grandma   She will start 10th grade at [employer] (may switch to home schooling)    Social Drivers of Health   Financial Resource Strain: Low Risk  (01/13/2024)   Received from [facility]   Overall Financial Resource Strain (CARDIA)    How hard is it for you to pay for the very basics like food, housing, medical care, and heating?: Not hard at all  Food Insecurity: No Food Insecurity (01/13/2024)   Received from [facility]   Hunger Vital Sign    Within the past 12 months, you worried that your food would run out before you got the money to buy more.: Never true    Within the past 12 months, the food you bought just didn't last and you didn't have money to get more.: Never true  Transportation Needs: No Transportation Needs (01/13/2024)   Received from [facility] - Transportation    In the past 12 months, has lack of transportation kept you from medical appointments or from getting medications?: No    In the past 12 months, has lack of transportation kept you from meetings, work, or from getting things needed for daily living?: No  Physical Activity: Sufficiently Active (05/12/2023)   Received from [facility]   Exercise Vital Sign    On average, how  many days per week do you engage in moderate to strenuous exercise (like a brisk walk)?: 4 days    On average, how many minutes do you engage in exercise at this level?: 40 min  Stress: Stress Concern Present (05/12/2023)   Received from [facility] - Occupational Stress Questionnaire    Feeling of Stress : To some extent  Social Connections: Socially Integrated (05/12/2023)   Received from [facility]   Social Network    How would you rate your social network (family, work, friends)?: Good participation with social networks  Intimate Partner Violence: Not At Risk (05/12/2023)   Received from [facility]   HITS  Over the last 12 months how often did your partner physically hurt you?: Never    Over the last 12 months how often did your partner insult you or talk down to you?: Never    Over the last 12 months how often did your partner threaten you with physical harm?: Never    Over the last 12 months how often did your partner scream or curse at you?: Never    Physical Exam: Vital signs in last 24 hours: BP (!) 127/90   Pulse 94   Temp 98.7 F (37.1 C) (Skin)   Ht 5' 5 (1.651 m)   Wt 182 lb (82.6 kg)   LMP  (LMP Unknown)   SpO2 100%   BMI 30.29 kg/m  GEN: NAD EYE: Sclerae anicteric ENT: MMM CV: Non-tachycardic Pulm: No increased WOB GI: Soft NEURO:  Alert & Oriented   Estefana Kidney, MD  Gastroenterology   01/29/2024 2:41 PM

## 2024-01-29 NOTE — Op Note (Addendum)
 [facility] Patient Name: Amy Cuevas Procedure Date: 01/29/2024 2:49 PM MRN: [id number] Endoscopist: Rosario Estefana Kidney , , [id number] Age: 18 Referring MD:  Date of Birth: [birth year] Gender: Female Account #: [id number] Procedure:                Upper GI endoscopy Indications:              Epigastric abdominal pain, Heartburn, Nausea with                            vomiting Medicines:                Monitored Anesthesia Care Procedure:                Pre-Anesthesia Assessment:                           - Prior to the procedure, a History and Physical                            was performed, and patient medications and                            allergies were reviewed. The patient's tolerance of                            previous anesthesia was also reviewed. The risks                            and benefits of the procedure and the sedation                            options and risks were discussed with the patient.                            All questions were answered, and informed consent                            was obtained. Prior Anticoagulants: The patient has                            taken no anticoagulant or antiplatelet agents. ASA                            Grade Assessment: II - A patient with mild systemic                            disease. After reviewing the risks and benefits,                            the patient was deemed in satisfactory condition to                            undergo the procedure.  After obtaining informed consent, the endoscope was                            passed under direct vision. Throughout the                            procedure, the patient's blood pressure, pulse, and                            oxygen saturations were monitored continuously. The                            GIF [id number] #[id number] was introduced through the                            mouth, and advanced to the second part  of duodenum.                            The upper GI endoscopy was accomplished without                            difficulty. The patient tolerated the procedure                            well. Scope In: Scope Out: Findings:                 Mucosal changes including longitudinal furrows and                            mild stenoses were found in the distal esophagus.                            Biopsies were obtained from the proximal and distal                            esophagus with cold forceps for histology of                            suspected eosinophilic esophagitis.                           Retained fluid was found in the gastric body.                           Localized mildly erythematous mucosa without                            bleeding was found in the gastric antrum. Biopsies                            were taken with a cold forceps for histology.                           The examined duodenum was normal.  Complications:            No immediate complications. Estimated Blood Loss:     Estimated blood loss was minimal. Impression:               - Esophageal mucosal changes.                           - Retained gastric fluid.                           - Erythematous mucosa in the antrum. Biopsied.                           - Normal examined duodenum.                           - Biopsies were taken with a cold forceps for                            evaluation of eosinophilic esophagitis. Recommendation:           - Await pathology results.                           - Increase to pantoprazole  40 mg BID for 10 weeks,                            then QD.                           - Consider gastric emptying study in the future.                           - Return to GI clinic in 2-3 months.                           - Perform a colonoscopy today. Dr Estefana Federico Rosario Estefana Federico,  01/29/2024 3:23:47 PM

## 2024-01-30 ENCOUNTER — Telehealth: Payer: Self-pay

## 2024-01-30 NOTE — Telephone Encounter (Signed)
  Follow up Call-     01/29/2024    1:58 PM  Call back number  Post procedure Call Back phone  # [phone]  Permission to leave phone message Yes     Patient questions:  Do you have a fever, pain , or abdominal swelling? No. Pain Score  0 *  Have you tolerated food without any problems? Yes.    Have you been able to return to your normal activities? Yes.    Do you have any questions about your discharge instructions: Diet   No. Medications  No. Follow up visit  No.  Do you have questions or concerns about your Care? No.  Actions: * If pain score is 4 or above: No action needed, pain <4.

## 2024-02-04 ENCOUNTER — Ambulatory Visit: Payer: Self-pay | Admitting: Internal Medicine

## 2024-02-04 ENCOUNTER — Encounter: Payer: Self-pay | Admitting: Internal Medicine

## 2024-02-04 LAB — SURGICAL PATHOLOGY

## 2024-02-11 ENCOUNTER — Telehealth: Payer: Self-pay

## 2024-02-11 NOTE — Telephone Encounter (Signed)
-----   Message from Nurse [place] B sent at 01/14/2024  8:40 AM EDT ----- Regarding: 4-week lab reminder sed rate and CRP/need to order/[place]

## 2024-02-11 NOTE — Telephone Encounter (Signed)
MyChart message sent to patient with lab reminder.  

## 2024-02-13 ENCOUNTER — Ambulatory Visit: Payer: Self-pay | Admitting: Physician Assistant

## 2024-02-13 ENCOUNTER — Other Ambulatory Visit (INDEPENDENT_AMBULATORY_CARE_PROVIDER_SITE_OTHER)

## 2024-02-13 DIAGNOSIS — R7982 Elevated C-reactive protein (CRP): Secondary | ICD-10-CM

## 2024-02-13 LAB — SEDIMENTATION RATE: Sed Rate: 18 mm/h (ref 0–20)

## 2024-02-13 LAB — HIGH SENSITIVITY CRP: CRP, High Sensitivity: 2.6 mg/L (ref 0.000–5.000)

## 2024-03-02 ENCOUNTER — Other Ambulatory Visit: Payer: Self-pay | Admitting: Internal Medicine

## 2024-03-02 DIAGNOSIS — R112 Nausea with vomiting, unspecified: Secondary | ICD-10-CM

## 2024-03-02 DIAGNOSIS — R1013 Epigastric pain: Secondary | ICD-10-CM

## 2024-03-29 NOTE — Progress Notes (Unsigned)
 03/30/2024 Amy Cuevas [id number] May 22, 2006  Referring provider: Samie Frederick, PA-C [place]) Primary GI doctor: Dr. Federico  ASSESSMENT AND PLAN:  Nausea, vomiting and abdominal distention with epigastric pain intermittent for the last year Dad with history of GB removal 01/12/2024 celiac negative 01/05/2024 CTAP W unremarkable stomach without wall thickening, normal colon and TI, normal small bowel 9//2025 EGD esophageal mucosal changes, retained gastric fluid antritis, normal duodenum Path negative H. pylori gastritis negative metaplasia reflux esophagitis No NSAIDs, alcohol, atopy -continue pantoprazole  40 mg daily -alginate therapy given -Lifestyle changes discussed, avoid NSAIDS, ETOH, hand out given to the patient -Weight loss discussed with the patient - will get HIDA scan, worse with fatty foods, family history of gallbladder, some pain on palpation -consider GES possibly post COVID gastroparesis, gastroparesis diet given - consider SIBO testing or empiric treatment  Constipation with nausea, abdominal distention, tenesmus 01/05/2024 CTAP W unremarkable stomach without wall thickening, normal colon and TI, normal small bowel 01/29/2024 colonoscopy excellent prep normal TI nonbleeding internal hemorrhoids no specimens collected recall age 69 2025 celiac negative 2025 KUB small to moderate stool burden Acute onset 1 year ago, Worsening in last 2 weeks, BM once a week with pebbles  Tried: Fleets, suppositories, linzess, a prep without any help, tried to increase water, walking after eating, amitiza - repeat bowel purge -trial of Motegrity 2 mg - consider SIBO testing or empiric treatment  Elevated alkaline phosphatase 01/05/2024 CTAP W normal liver normal gallbladder normal bile ducts normal pancreas normal spleen 05/14/2023 alk phos 119, AST 27 ALT 28, total bilirubin 0.2 01/05/2024 alk phos 134, AST 20, ALT 13, total bilirubin 0.4 Calcium 10.6 total protein 8.3,  albumin 4.6 previous vitamin D  deficiency 01/12/2024 GGT negative, normal alk phos  PCOS  Elevated sed rate CRP Repeat negative  Fatigue Follow up with PCP Consider sleep study  Patient Care Team: Samie Frederick, PA-C as PCP - General (Physician Assistant)  HISTORY OF PRESENT ILLNESS: 18 y.o. female with a past medical history listed below presents for evaluation of peptic ulcer.   I last saw the patient in the office 01/12/2024  Discussed the use of AI scribe software for clinical note transcription with the patient, who gave verbal consent to proceed.  History of Present Illness   Amy Cuevas is a 18 year old female who presents with persistent gastrointestinal symptoms and fatigue.  She has experienced severe constipation for approximately one year. Despite using Linzess and performing a bowel purge, she did not achieve significant relief. Initially, she had more regular bowel movements every two to three days, but this regularity decreased after about two weeks. She has tried Miralax , which occasionally results in small bowel movements after three to four days. Her stools are typically hard and come out in small pebbles.  Her diet is very bland, consisting of grilled and ground chicken, ground turkey, and fruits and vegetables, while avoiding fried, dairy-heavy, and greasy foods. She notes increased sensitivity to dairy, which she previously tolerated well. She experiences nausea, which has slightly improved with pantoprazole , but no vomiting. She also reports feeling full easily and experiencing bloating.  She experiences extreme fatigue, feeling tired regardless of rest, and reports shortness of breath and dizziness with walking. She has a history of insomnia and restless sleep but has not undergone a sleep study. She does not consume caffeine due to anxiety concerns.  She experiences heartburn, which is somewhat alleviated by pantoprazole , but she continues to adhere  to a bland diet.  She reports occasional pain in the lower back, particularly when lying down, and some discomfort in the right upper quadrant. No headaches, changes in vision, or vomiting. She reports cold hands.      She  reports that she has never smoked. She has never used smokeless tobacco. She reports that she does not drink alcohol and does not use drugs.  RELEVANT GI HISTORY, IMAGING AND LABS: Results   LABS Celiac: Negative Alkaline phosphatase: Resolved (03/13/2024)  RADIOLOGY CT scan: Normal (01/05/2024)  DIAGNOSTIC Endoscopy: Negative for Helicobacter pylori, negative for malignancy, showed reflux esophagitis Colonoscopy: Internal hemorrhoids, no inflammation, no polyps      CBC    Component Value Date/Time   WBC 6.1 01/12/2024 1153   RBC 4.56 01/12/2024 1153   HGB 13.1 01/12/2024 1153   HCT 38.8 01/12/2024 1153   PLT 396.0 01/12/2024 1153   MCV 84.9 01/12/2024 1153   MCH 28.7 01/05/2024 1448   MCHC 33.7 01/12/2024 1153   RDW 12.5 01/12/2024 1153   LYMPHSABS 2.0 01/12/2024 1153   MONOABS 0.4 01/12/2024 1153   EOSABS 0.1 01/12/2024 1153   BASOSABS 0.0 01/12/2024 1153   Recent Labs    01/05/24 1448 01/12/24 1153  HGB 14.6 13.1    CMP     Component Value Date/Time   NA 136 01/12/2024 1153   K 3.7 01/12/2024 1153   CL 99 01/12/2024 1153   CO2 29 01/12/2024 1153   GLUCOSE 96 01/12/2024 1153   BUN 8 01/12/2024 1153   CREATININE 0.77 01/12/2024 1153   CREATININE 0.91 08/06/2021 1441   CALCIUM 9.3 01/12/2024 1153   PROT 7.3 01/12/2024 1153   ALBUMIN 4.2 01/12/2024 1153   AST 14 01/12/2024 1153   ALT 11 01/12/2024 1153   ALKPHOS 98 01/12/2024 1153   BILITOT 0.4 01/12/2024 1153   GFRNONAA >60 01/05/2024 1448   GFRAA NOT CALCULATED 12/28/2012 2028      Latest Ref Rng & Units 01/12/2024   11:53 AM 01/05/2024    2:48 PM 08/06/2021    2:41 PM  Hepatic Function  Total Protein 6.0 - 8.3 g/dL 7.3  8.3  7.1   Albumin 3.5 - 5.2 g/dL 4.2  4.6    AST 0 - 37  U/L 14  20  19    ALT 0 - 35 U/L 11  13  23    Alk Phosphatase 47 - 119 U/L 98  134    Total Bilirubin 0.2 - 1.2 mg/dL 0.4  0.4  0.4       Current Medications:        Current Outpatient Medications (Other):    AMBULATORY NON FORMULARY MEDICATION, 1 Scoop daily. Medication Name: myo/d'chiro inositol 40 to 1 ratio powder mixed with water   escitalopram  (LEXAPRO ) 20 MG tablet, Take 20 mg by mouth daily.   hydrOXYzine (ATARAX/VISTARIL) 25 MG tablet, Take 25 mg by mouth 3 (three) times daily as needed.   Multiple Vitamin (MULTIVITAMIN) LIQD, Take 10 mLs by mouth daily. 2 teaspoons   Omega-3 Fatty Acids (FISH OIL) 1200 MG CAPS, Take 1 capsule by mouth daily.   pantoprazole  (PROTONIX ) 40 MG tablet, Increase pantoprazole  40 mg to twice a day for 10 weeks, then every day.   Prucalopride Succinate (MOTEGRITY) 2 MG TABS, Take 1 tablet (2 mg total) by mouth daily.   pantoprazole  (PROTONIX ) 20 MG tablet, Take 1 tablet (20 mg total) by mouth daily. (Patient not taking: Reported on 03/30/2024)  Medical History:  Past Medical History:  Diagnosis Date  ADHD (attention deficit hyperactivity disorder)    Anxiety    Anxiety disorder    PCOS (polycystic ovarian syndrome)    Scoliosis    Allergies: No Known Allergies   Surgical History:  She  has no past surgical history on file. Family History:  Her family history includes Cystic fibrosis in her brother; Diabetes type I in her paternal grandfather; Diabetes type II in her maternal grandmother and paternal grandmother; Endometriosis in her mother; Fibromyalgia in her maternal aunt; Hyperlipidemia in her father; Hypertension in her maternal grandmother and paternal grandmother; Irritable bowel syndrome in her maternal grandmother; Multiple sclerosis in her maternal aunt; Polycystic ovary syndrome in her mother.  REVIEW OF SYSTEMS  : All other systems reviewed and negative except where noted in the History of Present Illness.  PHYSICAL EXAM: BP  106/76   Pulse 93   Ht 5' 5 (1.651 m)   Wt 181 lb (82.1 kg)   BMI 30.12 kg/m  Physical Exam   GENERAL APPEARANCE: Well nourished, in no apparent distress. HEENT: No cervical lymphadenopathy, unremarkable thyroid, sclerae anicteric, conjunctiva pink. RESPIRATORY: Respiratory effort normal, breath sounds equal bilateral without rales, rhonchi, wheezing. CARDIO: Regular rate and rhythm with no murmurs, rubs, or gallops, peripheral pulses intact. ABDOMEN: Right upper quadrant tenderness, soft, non-distended, active bowel sounds in all four quadrants, no rebound, no mass appreciated. RECTAL: Declines. MUSCULOSKELETAL: Full range of motion, normal gait, without edema. SKIN: Dry, intact without rashes or lesions. No jaundice. NEURO: Alert, oriented, no focal deficits. PSYCH: Cooperative, normal mood and affect.      Alan JONELLE Coombs, PA-C 10:03 AM

## 2024-03-30 ENCOUNTER — Ambulatory Visit: Admitting: Physician Assistant

## 2024-03-30 ENCOUNTER — Other Ambulatory Visit

## 2024-03-30 ENCOUNTER — Encounter: Payer: Self-pay | Admitting: Physician Assistant

## 2024-03-30 ENCOUNTER — Ambulatory Visit: Payer: Self-pay | Admitting: Physician Assistant

## 2024-03-30 VITALS — BP 106/76 | HR 93 | Ht 65.0 in | Wt 181.0 lb

## 2024-03-30 DIAGNOSIS — E559 Vitamin D deficiency, unspecified: Secondary | ICD-10-CM

## 2024-03-30 DIAGNOSIS — R1013 Epigastric pain: Secondary | ICD-10-CM | POA: Diagnosis not present

## 2024-03-30 DIAGNOSIS — R748 Abnormal levels of other serum enzymes: Secondary | ICD-10-CM

## 2024-03-30 DIAGNOSIS — R7982 Elevated C-reactive protein (CRP): Secondary | ICD-10-CM

## 2024-03-30 DIAGNOSIS — K5904 Chronic idiopathic constipation: Secondary | ICD-10-CM

## 2024-03-30 DIAGNOSIS — R112 Nausea with vomiting, unspecified: Secondary | ICD-10-CM

## 2024-03-30 LAB — CBC WITH DIFFERENTIAL/PLATELET
Basophils Absolute: 0 K/uL (ref 0.0–0.1)
Basophils Relative: 0.6 % (ref 0.0–3.0)
Eosinophils Absolute: 0.3 K/uL (ref 0.0–0.7)
Eosinophils Relative: 4.4 % (ref 0.0–5.0)
HCT: 39.1 % (ref 36.0–46.0)
Hemoglobin: 13.3 g/dL (ref 12.0–15.0)
Lymphocytes Relative: 30.6 % (ref 12.0–46.0)
Lymphs Abs: 1.8 K/uL (ref 0.7–4.0)
MCHC: 33.9 g/dL (ref 30.0–36.0)
MCV: 85.2 fl (ref 78.0–100.0)
Monocytes Absolute: 0.5 K/uL (ref 0.1–1.0)
Monocytes Relative: 7.8 % (ref 3.0–12.0)
Neutro Abs: 3.4 K/uL (ref 1.4–7.7)
Neutrophils Relative %: 56.6 % (ref 43.0–77.0)
Platelets: 409 K/uL — ABNORMAL HIGH (ref 150.0–400.0)
RBC: 4.59 Mil/uL (ref 3.87–5.11)
RDW: 12.8 % (ref 11.5–14.6)
WBC: 6 K/uL (ref 4.5–10.5)

## 2024-03-30 LAB — BASIC METABOLIC PANEL WITH GFR
BUN: 14 mg/dL (ref 6–23)
CO2: 30 meq/L (ref 19–32)
Calcium: 9.6 mg/dL (ref 8.4–10.5)
Chloride: 101 meq/L (ref 96–112)
Creatinine, Ser: 0.87 mg/dL (ref 0.40–1.20)
GFR: 96.12 mL/min (ref >60.00)
Glucose, Bld: 79 mg/dL (ref 70–99)
Potassium: 3.9 meq/L (ref 3.5–5.1)
Sodium: 137 meq/L (ref 135–145)

## 2024-03-30 LAB — HEPATIC FUNCTION PANEL
ALT: 13 U/L (ref 0–35)
AST: 17 U/L (ref 0–37)
Albumin: 4.4 g/dL (ref 3.5–5.2)
Alkaline Phosphatase: 91 U/L (ref 39–117)
Bilirubin, Direct: 0.1 mg/dL (ref 0.0–0.3)
Total Bilirubin: 0.4 mg/dL (ref 0.2–1.2)
Total Protein: 7.7 g/dL (ref 6.0–8.3)

## 2024-03-30 MED ORDER — PRUCALOPRIDE SUCCINATE 2 MG PO TABS: 2.0000 mg | ORAL_TABLET | Freq: Every day | ORAL | 0 refills | Status: DC

## 2024-03-30 NOTE — Patient Instructions (Signed)
 Your provider has requested that you go to the basement level for lab work before leaving today. Press B on the elevator. The lab is located at the first door on the left as you exit the elevator.  You have been scheduled for a HIDA scan at [facility] (1st floor) on 04/02/24. Please arrive 15 minutes prior to your scheduled appointment at  8:00 am. Make certain not to have anything to eat or drink at least 6 hours prior to your test. Should this appointment date or time not work well for you, please call radiology scheduling at [phone].  _____________________________________________________________________ hepatobiliary (HIDA) scan is an imaging procedure used to diagnose problems in the liver, gallbladder and bile ducts. In the HIDA scan, a radioactive chemical or tracer is injected into a vein in your arm. The tracer is handled by the liver like bile. Bile is a fluid produced and excreted by your liver that helps your digestive system break down fats in the foods you eat. Bile is stored in your gallbladder and the gallbladder releases the bile when you eat a meal. A special nuclear medicine scanner (gamma camera) tracks the flow of the tracer from your liver into your gallbladder and small intestine.  During your HIDA scan  You'll be asked to change into a hospital gown before your HIDA scan begins. Your health care team will position you on a table, usually on your back. The radioactive tracer is then injected into a vein in your arm.The tracer travels through your bloodstream to your liver, where it's taken up by the bile-producing cells. The radioactive tracer travels with the bile from your liver into your gallbladder and through your bile ducts to your small intestine.You may feel some pressure while the radioactive tracer is injected into your vein. As you lie on the table, a special gamma camera is positioned over your abdomen taking pictures of the tracer as it moves through your  body. The gamma camera takes pictures continually for about an hour. You'll need to keep still during the HIDA scan. This can become uncomfortable, but you may find that you can lessen the discomfort by taking deep breaths and thinking about other things. Tell your health care team if you're uncomfortable. The radiologist will watch on a computer the progress of the radioactive tracer through your body. The HIDA scan may be stopped when the radioactive tracer is seen in the gallbladder and enters your small intestine. This typically takes about an hour. In some cases extra imaging will be performed if original images aren't satisfactory, if morphine is given to help visualize the gallbladder or if the medication CCK is given to look at the contraction of the gallbladder. This test typically takes 2 hours to complete. ________________________________________________________________________  Gastroparesis Gastroparesis is a condition in which food takes longer than normal to empty from the stomach.  This condition is also known as delayed gastric emptying. It is usually a long-term (chronic) condition.  What are the signs or symptoms? Symptoms of this condition include: Feeling full after eating very little or a loss of appetite. Nausea, vomiting, or heartburn. Bloating of your abdomen. Inconsistent blood sugar (glucose) levels on blood tests. Unexplained weight loss. Acid from the stomach coming up into the esophagus (gastroesophageal reflux). Sudden tightening (spasm) of the stomach, which can be painful. Symptoms may come and go. Some people may not notice any symptoms.  What increases the risk? You are more likely to develop this condition if: You have certain  disorders or diseases. These may include: An endocrine disorder. An eating disorder. Amyloidosis. Scleroderma. Parkinson's disease. Multiple sclerosis. Cancer or infection of the stomach or the vagus nerve. You have had surgery on  your stomach or vagus nerve. You take certain medicines. You are female.  Things you can do: Please do small frequent meals like 4-6 meals a day.  Eat and drink liquids at separate times.  Avoid high fiber foods, cook your vegetables, avoid high fat food.  Suggest spreading protein throughout the day (greek yogurt, glucerna, soft meat, milk, eggs) Choose soft foods that you can mash with a fork When you are more symptomatic, change to pureed foods foods and liquids.  Consider reading Living well with Gastroparesis by [facility] Check out this link to a diet online https://my.groupjournal.fr  Please do the following: Purchase a bottle of Miralax  over the counter as well as a box of 5 mg dulcolax tablets. Take 4 dulcolax tablets. Wait 1 hour. You will then drink 6-8 capfuls of Miralax  mixed in an adequate amount of water/juice/gatorade (you may choose which of these liquids to drink) over the next 2-3 hours. You should expect results within 1 to 6 hours after completing the bowel purge. Go to the er if you have severe AB pain, can not pass gas or stool in over 12 hours, can not hold down any food.   Small intestinal bacterial overgrowth (SIBO) occurs when there is an abnormal increase in the overall bacterial population in the small intestine -- particularly types of bacteria not commonly found in that part of the digestive tract. Small intestinal bacterial overgrowth (SIBO) commonly results when a circumstance -- such as surgery or disease -- slows the passage of food and waste products in the digestive tract, creating a breeding ground for bacteria.  Signs and symptoms of SIBO often include: Loss of appetite Abdominal pain Nausea Bloating An uncomfortable feeling of fullness after eating Diarrhea or constipation, depending on the type of gas produced  What foods trigger SIBO? While  foods aren't the original cause of SIBO, certain foods do encourage the overgrowth of the wrong bacteria in your small intestine. If you're feeding them their favorite foods, they're going to grow more, and that will trigger more of your SIBO symptoms. By the same token, you can help reduce the overgrowth by starving the problematic bacteria of their favorite foods. This strategy has led to a number of proposed SIBO eating plans. The plans vary, and so do individual results. But in general, they tend to recommend limiting carbohydrates.  These include: Sugars and sweeteners. Fruits and starchy vegetables. Dairy products. Grains.  There is a test for this we can do called a breath test, if you are positive we will treat you with an antibiotic to see if it helps.  Your symptoms are very suspicious for this condition, as discussed, we will start you on an antibiotic to see if this helps.   _______________________________________________________  If your blood pressure at your visit was 140/90 or greater, please contact your primary care physician to follow up on this.  _______________________________________________________  If you are age 71 or older, your body mass index should be between 23-30. Your Body mass index is 30.12 kg/m. If this is out of the aforementioned range listed, please consider follow up with your Primary Care Provider.  If you are age 65 or younger, your body mass index should be between 19-25. Your Body mass index is 30.12 kg/m. If this is out of  the aformentioned range listed, please consider follow up with your Primary Care Provider.   ________________________________________________________  The [facility] providers would like to encourage you to use MYCHART to communicate with providers for non-urgent requests or questions.  Due to long hold times on the telephone, sending your provider a message by [facility] may be a faster and more efficient way to get a response.   Please allow 48 business hours for a response.  Please remember that this is for non-urgent requests.  _______________________________________________________  [facility] is using a team-based approach to care.  Your team is made up of your doctor and two to three APPS. Our APPS (Nurse Practitioners and Physician Assistants) work with your physician to ensure care continuity for you. They are fully qualified to address your health concerns and develop a treatment plan. They communicate directly with your gastroenterologist to care for you. Seeing the Advanced Practice Practitioners on your physician's team can help you by facilitating care more promptly, often allowing for earlier appointments, access to diagnostic testing, procedures, and other specialty referrals.

## 2024-04-02 ENCOUNTER — Encounter (HOSPITAL_COMMUNITY)
Admission: RE | Admit: 2024-04-02 | Discharge: 2024-04-02 | Disposition: A | Discharge status: Home / Self Care | Source: Ambulatory Visit | Attending: Physician Assistant | Admitting: Physician Assistant

## 2024-04-02 DIAGNOSIS — R1013 Epigastric pain: Secondary | ICD-10-CM | POA: Diagnosis present

## 2024-04-02 DIAGNOSIS — R748 Abnormal levels of other serum enzymes: Secondary | ICD-10-CM | POA: Diagnosis present

## 2024-04-02 DIAGNOSIS — R112 Nausea with vomiting, unspecified: Secondary | ICD-10-CM | POA: Insufficient documentation

## 2024-04-02 MED ORDER — TECHNETIUM TC 99M MEBROFENIN IV KIT
5.0900 | PACK | Freq: Once | INTRAVENOUS | Status: AC
  Administered 2024-04-02: 5.09 via INTRAVENOUS

## 2024-04-06 ENCOUNTER — Telehealth: Payer: Self-pay | Admitting: Physician Assistant

## 2024-04-06 NOTE — Telephone Encounter (Signed)
 Checked [facility] - patient has been scheduled for an appt with Dr. Tanda on 04/08/24 at 3 PM.

## 2024-04-06 NOTE — Telephone Encounter (Signed)
 Inbound call from patient mother requesting to have a expedited referral so her daughter can have her gall bladder looked at. Patient mother is requesting to speak to the nurse. please advise.

## 2024-04-06 NOTE — Telephone Encounter (Signed)
 Lm on vm for patient's mother Avelina to return call. Urgent referral was placed to [facility] earlier today., should be contacting patient within 1 week. They can call [facility] at [phone] to see if patient can be scheduled sooner.

## 2024-04-09 ENCOUNTER — Encounter (HOSPITAL_COMMUNITY): Payer: Self-pay | Admitting: General Surgery

## 2024-04-09 ENCOUNTER — Other Ambulatory Visit: Payer: Self-pay

## 2024-04-09 NOTE — Progress Notes (Signed)
 Date of COVID positive in last 90 days:  No  PCP - Tylene Meres, MD Cardiologist - N/A  Chest x-ray - N/A EKG - N/A Stress Test - N/A ECHO - N/A Cardiac Cath - N/A Pacemaker/ICD device last checked:N/A Spinal Cord Stimulator:N/A  Bowel Prep - N/A  Sleep Study - N/A CPAP -   Fasting Blood Sugar - N/A Checks Blood Sugar _____ times a day  Last dose of GLP1 agonist-  N/A GLP1 instructions:  Do not take after     Last dose of SGLT-2 inhibitors-  N/A SGLT-2 instructions:  Do not take after    Blood Thinner Instructions: N/A Last dose:   Time: Aspirin Instructions:N/A Last Dose:  Activity level:  Can go up a flight of stairs and perform activities of daily living without stopping and without symptoms of chest pain or shortness of breath.  Able to exercise without symptoms  Anesthesia review: N/A  Patient denies shortness of breath, fever, cough and chest pain at PAT appointment (completed over the phone)  Patient verbalized understanding of instructions that were given to them at the PAT appointment. Patient was also instructed that they will need to review over the PAT instructions again at home before surgery.

## 2024-04-12 NOTE — Anesthesia Preprocedure Evaluation (Signed)
 Anesthesia Evaluation  Patient identified by MRN, date of birth, ID band Patient awake    Reviewed: Allergy & Precautions, NPO status , Patient's Chart, lab work & pertinent test results  History of Anesthesia Complications Negative for: history of anesthetic complications  Airway Mallampati: I  TM Distance: >3 FB Neck ROM: Full    Dental no notable dental hx.    Pulmonary neg pulmonary ROS   Pulmonary exam normal        Cardiovascular negative cardio ROS Normal cardiovascular exam     Neuro/Psych   Anxiety Depression    negative neurological ROS     GI/Hepatic Neg liver ROS,GERD  Medicated and Controlled,,Biliary colic   Endo/Other  negative endocrine ROS    Renal/GU negative Renal ROS     Musculoskeletal negative musculoskeletal ROS (+)    Abdominal   Peds  Hematology negative hematology ROS (+)   Anesthesia Other Findings   Reproductive/Obstetrics                              Anesthesia Physical Anesthesia Plan  ASA: 2  Anesthesia Plan: General   Post-op Pain Management: Tylenol  PO (pre-op)* and Toradol IV (intra-op)*   Induction: Intravenous  PONV Risk Score and Plan: 3 and Treatment may vary due to age or medical condition, Ondansetron , Dexamethasone , Midazolam, Propofol infusion, Scopolamine patch - Pre-op and TIVA  Airway Management Planned: Oral ETT  Additional Equipment: None  Intra-op Plan:   Post-operative Plan: Extubation in OR  Informed Consent: I have reviewed the patients History and Physical, chart, labs and discussed the procedure including the risks, benefits and alternatives for the proposed anesthesia with the patient or authorized representative who has indicated his/her understanding and acceptance.     Dental advisory given  Plan Discussed with: CRNA  Anesthesia Plan Comments:          Anesthesia Quick Evaluation

## 2024-04-13 ENCOUNTER — Ambulatory Visit (HOSPITAL_COMMUNITY): Payer: Self-pay | Admitting: Anesthesiology

## 2024-04-13 ENCOUNTER — Encounter (HOSPITAL_COMMUNITY)
Admission: RE | Disposition: A | Discharge status: Home / Self Care | Payer: Self-pay | Source: Home / Self Care | Attending: General Surgery

## 2024-04-13 ENCOUNTER — Ambulatory Visit (HOSPITAL_COMMUNITY)
Admission: RE | Admit: 2024-04-13 | Discharge: 2024-04-13 | Disposition: A | Discharge status: Home / Self Care | Attending: General Surgery | Admitting: General Surgery

## 2024-04-13 ENCOUNTER — Encounter (HOSPITAL_COMMUNITY): Payer: Self-pay | Admitting: General Surgery

## 2024-04-13 ENCOUNTER — Other Ambulatory Visit: Payer: Self-pay

## 2024-04-13 ENCOUNTER — Ambulatory Visit: Payer: Self-pay | Admitting: General Surgery

## 2024-04-13 DIAGNOSIS — K219 Gastro-esophageal reflux disease without esophagitis: Secondary | ICD-10-CM | POA: Diagnosis not present

## 2024-04-13 DIAGNOSIS — K828 Other specified diseases of gallbladder: Secondary | ICD-10-CM

## 2024-04-13 DIAGNOSIS — Z01818 Encounter for other preprocedural examination: Secondary | ICD-10-CM

## 2024-04-13 HISTORY — PX: CHOLECYSTECTOMY: SHX55

## 2024-04-13 HISTORY — DX: Depression, unspecified: F32.A

## 2024-04-13 HISTORY — DX: Gastro-esophageal reflux disease without esophagitis: K21.9

## 2024-04-13 LAB — POCT PREGNANCY, URINE: Preg Test, Ur: NEGATIVE

## 2024-04-13 SURGERY — LAPAROSCOPIC CHOLECYSTECTOMY
Anesthesia: General | Site: Abdomen

## 2024-04-13 MED ORDER — ONDANSETRON HCL 4 MG/2ML IJ SOLN
INTRAMUSCULAR | Status: DC | PRN
  Administered 2024-04-13: 4 mg via INTRAVENOUS

## 2024-04-13 MED ORDER — SUGAMMADEX SODIUM 200 MG/2ML IV SOLN
INTRAVENOUS | Status: DC | PRN
  Administered 2024-04-13: 200 mg via INTRAVENOUS

## 2024-04-13 MED ORDER — FENTANYL CITRATE (PF) 250 MCG/5ML IJ SOLN
INTRAMUSCULAR | Status: AC
  Filled 2024-04-13: qty 5

## 2024-04-13 MED ORDER — OXYCODONE HCL 5 MG PO TABS
5.0000 mg | ORAL_TABLET | Freq: Once | ORAL | Status: AC | PRN
  Administered 2024-04-13: 5 mg via ORAL

## 2024-04-13 MED ORDER — ACETAMINOPHEN 500 MG PO TABS
1000.0000 mg | ORAL_TABLET | Freq: Once | ORAL | Status: AC
  Administered 2024-04-13: 1000 mg via ORAL
  Filled 2024-04-13: qty 2

## 2024-04-13 MED ORDER — BUPIVACAINE-EPINEPHRINE 0.25% -1:200000 IJ SOLN
INTRAMUSCULAR | Status: DC | PRN
  Administered 2024-04-13: 30 mL

## 2024-04-13 MED ORDER — SUGAMMADEX SODIUM 200 MG/2ML IV SOLN
INTRAVENOUS | Status: AC
  Filled 2024-04-13: qty 2

## 2024-04-13 MED ORDER — BUPIVACAINE-EPINEPHRINE (PF) 0.25% -1:200000 IJ SOLN
INTRAMUSCULAR | Status: AC
  Filled 2024-04-13: qty 30

## 2024-04-13 MED ORDER — DEXAMETHASONE SOD PHOSPHATE PF 10 MG/ML IJ SOLN
INTRAMUSCULAR | Status: DC | PRN
  Administered 2024-04-13: 8 mg via INTRAVENOUS

## 2024-04-13 MED ORDER — KETOROLAC TROMETHAMINE 30 MG/ML IJ SOLN
INTRAMUSCULAR | Status: AC
  Filled 2024-04-13: qty 1

## 2024-04-13 MED ORDER — PROPOFOL 1000 MG/100ML IV EMUL
INTRAVENOUS | Status: AC
  Filled 2024-04-13: qty 100

## 2024-04-13 MED ORDER — OXYCODONE HCL 5 MG PO TABS: 5.0000 mg | ORAL_TABLET | Freq: Four times a day (QID) | ORAL | 0 refills | Status: DC | PRN

## 2024-04-13 MED ORDER — ORAL CARE MOUTH RINSE: 15.0000 mL | Freq: Once | OROMUCOSAL | Status: AC

## 2024-04-13 MED ORDER — FENTANYL CITRATE (PF) 250 MCG/5ML IJ SOLN
INTRAMUSCULAR | Status: DC | PRN
  Administered 2024-04-13 (×4): 50 ug via INTRAVENOUS
  Administered 2024-04-13 (×2): 25 ug via INTRAVENOUS

## 2024-04-13 MED ORDER — PROPOFOL 500 MG/50ML IV EMUL
INTRAVENOUS | Status: DC | PRN
  Administered 2024-04-13: 150 ug/kg/min via INTRAVENOUS

## 2024-04-13 MED ORDER — OXYCODONE HCL 5 MG PO TABS
ORAL_TABLET | ORAL | Status: AC
  Filled 2024-04-13: qty 1

## 2024-04-13 MED ORDER — KETOROLAC TROMETHAMINE 30 MG/ML IJ SOLN
INTRAMUSCULAR | Status: DC | PRN
  Administered 2024-04-13: 30 mg via INTRAVENOUS

## 2024-04-13 MED ORDER — ENSURE PRE-SURGERY PO LIQD: 296.0000 mL | Freq: Once | ORAL | Status: DC

## 2024-04-13 MED ORDER — CHLORHEXIDINE GLUCONATE CLOTH 2 % EX PADS: 6.0000 | MEDICATED_PAD | Freq: Once | CUTANEOUS | Status: DC

## 2024-04-13 MED ORDER — LIDOCAINE HCL (PF) 2 % IJ SOLN
INTRAMUSCULAR | Status: AC
  Filled 2024-04-13: qty 5

## 2024-04-13 MED ORDER — FENTANYL CITRATE (PF) 50 MCG/ML IJ SOSY: 25.0000 ug | PREFILLED_SYRINGE | INTRAMUSCULAR | Status: DC | PRN

## 2024-04-13 MED ORDER — OXYCODONE HCL 5 MG/5ML PO SOLN: 5.0000 mg | Freq: Once | ORAL | Status: AC | PRN

## 2024-04-13 MED ORDER — LIDOCAINE HCL (PF) 2 % IJ SOLN
INTRAMUSCULAR | Status: DC | PRN
  Administered 2024-04-13: 100 mg via INTRADERMAL

## 2024-04-13 MED ORDER — SCOPOLAMINE 1 MG/3DAYS TD PT72
1.0000 | MEDICATED_PATCH | TRANSDERMAL | Status: DC
  Administered 2024-04-13: 1 mg via TRANSDERMAL
  Filled 2024-04-13: qty 1

## 2024-04-13 MED ORDER — CHLORHEXIDINE GLUCONATE 0.12 % MT SOLN
15.0000 mL | Freq: Once | OROMUCOSAL | Status: AC
  Administered 2024-04-13: 15 mL via OROMUCOSAL

## 2024-04-13 MED ORDER — MIDAZOLAM HCL 2 MG/2ML IJ SOLN
INTRAMUSCULAR | Status: AC
  Filled 2024-04-13: qty 2

## 2024-04-13 MED ORDER — ACETAMINOPHEN 500 MG PO TABS: 1000.0000 mg | ORAL_TABLET | ORAL | Status: AC

## 2024-04-13 MED ORDER — LACTATED RINGERS IR SOLN
Status: DC | PRN
  Administered 2024-04-13: 1000 mL

## 2024-04-13 MED ORDER — SPY AGENT GREEN - (INDOCYANINE FOR INJECTION)
1.2500 mg | Freq: Once | INTRAMUSCULAR | Status: AC
  Administered 2024-04-13: 1.25 mg via INTRAVENOUS

## 2024-04-13 MED ORDER — DROPERIDOL 2.5 MG/ML IJ SOLN: 0.6250 mg | Freq: Once | INTRAMUSCULAR | Status: DC | PRN

## 2024-04-13 MED ORDER — ACETAMINOPHEN 500 MG PO TABS: 1000.0000 mg | ORAL_TABLET | Freq: Four times a day (QID) | ORAL | Status: AC

## 2024-04-13 MED ORDER — ONDANSETRON HCL 4 MG/2ML IJ SOLN
INTRAMUSCULAR | Status: AC
Start: 2024-04-13 — End: 2024-04-13
  Filled 2024-04-13: qty 2

## 2024-04-13 MED ORDER — LACTATED RINGERS IV SOLN: INTRAVENOUS | Status: DC

## 2024-04-13 MED ORDER — PROPOFOL 10 MG/ML IV BOLUS
INTRAVENOUS | Status: AC
  Filled 2024-04-13: qty 20

## 2024-04-13 MED ORDER — MIDAZOLAM HCL (PF) 2 MG/2ML IJ SOLN
INTRAMUSCULAR | Status: DC | PRN
  Administered 2024-04-13: 2 mg via INTRAVENOUS

## 2024-04-13 MED ORDER — ROCURONIUM BROMIDE 10 MG/ML (PF) SYRINGE
PREFILLED_SYRINGE | INTRAVENOUS | Status: DC | PRN
  Administered 2024-04-13: 60 mg via INTRAVENOUS

## 2024-04-13 MED ORDER — CEFAZOLIN SODIUM-DEXTROSE 2-4 GM/100ML-% IV SOLN
2.0000 g | INTRAVENOUS | Status: AC
  Administered 2024-04-13: 2 g via INTRAVENOUS
  Filled 2024-04-13: qty 100

## 2024-04-13 MED ORDER — PROPOFOL 10 MG/ML IV BOLUS
INTRAVENOUS | Status: DC | PRN
  Administered 2024-04-13: 200 mg via INTRAVENOUS

## 2024-04-13 SURGICAL SUPPLY — 41 items
APPLICATOR ARISTA FLEXITIP XL (MISCELLANEOUS) IMPLANT
BAG COUNTER SPONGE SURGICOUNT (BAG) IMPLANT
BENZOIN TINCTURE PRP APPL 2/3 (GAUZE/BANDAGES/DRESSINGS) IMPLANT
CHLORAPREP W/TINT 26 (MISCELLANEOUS) ×2 IMPLANT
CLIP APPLIE 5 13 M/L LIGAMAX5 (MISCELLANEOUS) IMPLANT
CLIP APPLIE ROT 10 11.4 M/L (STAPLE) IMPLANT
CLIP LIGATING HEMO O LOK GREEN (MISCELLANEOUS) IMPLANT
COVER MAYO STAND XLG (MISCELLANEOUS) IMPLANT
COVER SURGICAL LIGHT HANDLE (MISCELLANEOUS) ×2 IMPLANT
DERMABOND ADVANCED .7 DNX12 (GAUZE/BANDAGES/DRESSINGS) IMPLANT
DRAPE C-ARM 42X120 X-RAY (DRAPES) IMPLANT
DRSG TEGADERM 2-3/8X2-3/4 SM (GAUZE/BANDAGES/DRESSINGS) ×6 IMPLANT
DRSG TEGADERM 4X4.75 (GAUZE/BANDAGES/DRESSINGS) ×2 IMPLANT
ELECT REM PT RETURN 15FT ADLT (MISCELLANEOUS) ×2 IMPLANT
GAUZE SPONGE 2X2 8PLY STRL LF (GAUZE/BANDAGES/DRESSINGS) ×2 IMPLANT
GLOVE BIO SURGEON STRL SZ7.5 (GLOVE) ×2 IMPLANT
GLOVE INDICATOR 8.0 STRL GRN (GLOVE) ×2 IMPLANT
GOWN STRL REUS W/ TWL XL LVL3 (GOWN DISPOSABLE) ×2 IMPLANT
GRASPER SUT TROCAR 14GX15 (MISCELLANEOUS) IMPLANT
HEMOSTAT ARISTA ABSORB 3G PWDR (HEMOSTASIS) IMPLANT
HEMOSTAT SNOW SURGICEL 2X4 (HEMOSTASIS) IMPLANT
IRRIGATION SUCT STRKRFLW 2 WTP (MISCELLANEOUS) ×2 IMPLANT
KIT BASIN OR (CUSTOM PROCEDURE TRAY) ×2 IMPLANT
KIT TURNOVER KIT A (KITS) ×2 IMPLANT
POUCH RETRIEVAL ECOSAC 10 (ENDOMECHANICALS) ×2 IMPLANT
SCISSORS LAP 5X35 DISP (ENDOMECHANICALS) ×2 IMPLANT
SET CHOLANGIOGRAPH MIX (MISCELLANEOUS) IMPLANT
SET TUBE SMOKE EVAC HIGH FLOW (TUBING) ×2 IMPLANT
SLEEVE ADV FIXATION 5X100MM (TROCAR) ×2 IMPLANT
SPIKE FLUID TRANSFER (MISCELLANEOUS) ×2 IMPLANT
STRIP CLOSURE SKIN 1/2X4 (GAUZE/BANDAGES/DRESSINGS) ×2 IMPLANT
SUT MNCRL AB 4-0 PS2 18 (SUTURE) ×2 IMPLANT
SUT VIC AB 0 UR5 27 (SUTURE) IMPLANT
SUT VICRYL 0 TIES 12 18 (SUTURE) IMPLANT
SUT VICRYL 0 UR6 27IN ABS (SUTURE) ×2 IMPLANT
TOWEL OR DSP ST BLU DLX 10/PK (DISPOSABLE) ×2 IMPLANT
TRAY LAPAROSCOPIC (CUSTOM PROCEDURE TRAY) ×2 IMPLANT
TROCAR ADV FIXATION 12X100MM (TROCAR) IMPLANT
TROCAR ADV FIXATION 5X100MM (TROCAR) ×2 IMPLANT
TROCAR BALLN 12MMX100 BLUNT (TROCAR) IMPLANT
TROCAR XCEL NON-BLD 5MMX100MML (ENDOMECHANICALS) IMPLANT

## 2024-04-13 NOTE — Interval H&P Note (Signed)
 History and Physical Interval Note:  04/13/2024 9:07 AM  Amy Cuevas  has presented today for surgery, with the diagnosis of BILIARY COLIC.  The various methods of treatment have been discussed with the patient and family. After consideration of risks, benefits and other options for treatment, the patient has consented to  Procedure(s): LAPAROSCOPIC CHOLECYSTECTOMY (N/A) as a surgical intervention.  The patient's history has been reviewed, patient examined, no change in status, stable for surgery.  I have reviewed the patient's chart and labs.  Questions were answered to the patient's satisfaction.    Discussed resident involvement with case  Camellia Blush

## 2024-04-13 NOTE — Anesthesia Procedure Notes (Signed)
 Procedure Name: Intubation Date/Time: 04/13/2024 9:39 AM  Performed by: Augusta Daved SAILOR, CRNAPre-anesthesia Checklist: Patient identified, Emergency Drugs available, Suction available and Patient being monitored Patient Re-evaluated:Patient Re-evaluated prior to induction Oxygen Delivery Method: Circle System Utilized Preoxygenation: Pre-oxygenation with 100% oxygen Induction Type: IV induction Ventilation: Mask ventilation without difficulty Laryngoscope Size: Miller and 2 Grade View: Grade I Tube type: Oral Tube size: 7.0 mm Number of attempts: 2 Airway Equipment and Method: Stylet and Oral airway Placement Confirmation: ETT inserted through vocal cords under direct vision, positive ETCO2 and breath sounds checked- equal and bilateral Secured at: 21 (at the teeth) cm Tube secured with: Tape Dental Injury: Teeth and Oropharynx as per pre-operative assessment  Comments: 1st attempt with 7.5 ETT unsuccessful; pt. BMV in between attempts. 2nd attempt successful with 7.0 cuffed ETT.

## 2024-04-13 NOTE — Anesthesia Postprocedure Evaluation (Signed)
 Anesthesia Post Note  Patient: Amy Cuevas  Procedure(s) Performed: LAPAROSCOPIC CHOLECYSTECTOMY (Abdomen)     Patient location during evaluation: PACU Anesthesia Type: General Level of consciousness: awake and alert Pain management: pain level controlled Vital Signs Assessment: post-procedure vital signs reviewed and stable Respiratory status: spontaneous breathing, nonlabored ventilation and respiratory function stable Cardiovascular status: blood pressure returned to baseline Postop Assessment: no apparent nausea or vomiting Anesthetic complications: no   No notable events documented.  Last Vitals:  Vitals:   04/13/24 1100 04/13/24 1115  BP: 133/84 125/63  Pulse: 96 77  Resp: (!) 23 17  Temp:  36.8 C  SpO2: 100% 100%    Last Pain:  Vitals:   04/13/24 1115  TempSrc:   PainSc: 4                  Vertell Row

## 2024-04-13 NOTE — Op Note (Signed)
 Amy Cuevas [id number] [birth year] 04/13/2024  Laparoscopic Cholecystectomy with near infrared fluorescent cholangiography procedure Note  Indications: This patient presents with symptomatic gallbladder disease and will undergo laparoscopic cholecystectomy.  Pre-operative Diagnosis: biliary dyskinesia  Post-operative Diagnosis: Same  Surgeon: Camellia Blush MD FACS  Assistants: Margretta Brash MD PGY-3 surgical resident  Anesthesia: General endotracheal anesthesia   Procedure Details  The patient was seen again in the Holding Room. The risks, benefits, complications, treatment options, and expected outcomes were discussed with the patient. The possibilities of reaction to medication, pulmonary aspiration, perforation of viscus, bleeding, recurrent infection, finding a normal gallbladder, the need for additional procedures, failure to diagnose a condition, the possible need to convert to an open procedure, and creating a complication requiring transfusion or operation were discussed with the patient. The likelihood of improving the patient's symptoms with return to their baseline status is good.  The patient and/or family concurred with the proposed plan, giving informed consent. The site of surgery properly noted. The patient was taken to Operating Room, identified as Amy Cuevas and the procedure verified as Laparoscopic Cholecystectomy with ICG dye.  A Time Out was held and the above information confirmed. Antibiotic prophylaxis was administered.    ICG dye was administered preoperatively.    General endotracheal anesthesia was then administered and tolerated well. After the induction, the abdomen was prepped with Chloraprep and draped in the sterile fashion. The patient was positioned in the supine position.  Local anesthetic agent was injected into the skin near the umbilicus and an incision made. We dissected down to the abdominal fascia with blunt dissection.  The fascia was  incised vertically and we entered the peritoneal cavity bluntly.  A pursestring suture of 0-Vicryl was placed around the fascial opening.  The Hasson cannula was inserted and secured with the stay suture.  Pneumoperitoneum was then created with CO2 and tolerated well without any adverse changes in the patient's vital signs. An 5-mm port was placed in the subxiphoid position.  Two 5-mm ports were placed in the right upper quadrant. All skin incisions were infiltrated with a local anesthetic agent before making the incision and placing the trocars.   We positioned the patient in reverse Trendelenburg, tilted slightly to the patient's left.  The gallbladder was identified, the fundus grasped and retracted cephalad. Adhesions were lysed bluntly and with the electrocautery where indicated, taking care not to injure any adjacent organs or viscus. The infundibulum was grasped and retracted laterally, exposing the peritoneum overlying the triangle of Calot. This was then divided and exposed in a blunt fashion. A critical view of the cystic duct and cystic artery was obtained.  The cystic duct was clearly identified and bluntly dissected circumferentially.  Utilizing the St[employer]ker camera system near infrared fluorescent activity was visualized in the liver, cystic duct, common hepatic duct and common bile duct and small bowel.  This served as a secondary confirmation of our anatomy.  There were no other structures entering the gallbladder.  The patient did have a small anterior cystic artery and a more prominent posterior branch.  At this point the resident took over and I held retraction. The cystic duct was then ligated with 2 clips on patient side and 1 on specimen side and divided. The cystic artery (both the small anterior and larger posterior branch) which had been identified & dissected free were ligated with clips and divided as well.   The gallbladder was dissected from the liver bed in retrograde fashion with  the electrocautery. The  gallbladder was removed and placed in an Ecco sac.  The gallbladder and Ecco sac were then removed through the umbilical port site. The liver bed was irrigated and inspected. Hemostasis was achieved with the electrocautery. Copious irrigation was utilized and was repeatedly aspirated until clear.  The pursestring suture was used to close the umbilical fascia.  An additional interrupted 0 Vicryl was placed using a PMI suture passer with laparoscopic guidance  We again inspected the right upper quadrant for hemostasis.  The umbilical closure was inspected and there was no air leak and nothing trapped within the closure. Pneumoperitoneum was released as we removed the trocars.  4-0 Monocryl was used to close the skin.   Steri-Strips, 2 by twos and Tegaderms were applied. The patient was then extubated and brought to the recovery room in stable condition. Instrument, sponge, and needle counts were correct at closure and at the conclusion of the case.   Findings:  critical view Infrared fluorescent cholangiography visualized within the common hepatic duct, common bile duct, cystic duct and small bowel  Estimated Blood Loss: Minimal         Drains: none         Specimens: Gallbladder           Complications: None; patient tolerated the procedure well.         Disposition: PACU - hemodynamically stable.         Condition: stable  I was personally present & scrubbed during the entire procedure as documented in my operative note.   Camellia HERO. Tanda, MD, FACS General, Bariatric, & Minimally Invasive Surgery [facility],  [facility]

## 2024-04-13 NOTE — Discharge Instructions (Signed)

## 2024-04-13 NOTE — Transfer of Care (Signed)
 Immediate Anesthesia Transfer of Care Note  Patient: Amy Cuevas  Procedure(s) Performed: LAPAROSCOPIC CHOLECYSTECTOMY (Abdomen)  Patient Location: PACU  Anesthesia Type:General  Level of Consciousness: awake, oriented, and patient cooperative  Airway & Oxygen Therapy: Patient Spontanous Breathing and Patient connected to face mask oxygen  Post-op Assessment: Report given to RN and Post -op Vital signs reviewed and stable  Post vital signs: Reviewed and stable  Last Vitals:  Vitals Value Taken Time  BP 115/66 04/13/24 10:46  Temp 98.5 04/13/24 10:48  Pulse 83 04/13/24 10:47  Resp 19 04/13/24 10:47  SpO2 99 % 04/13/24 10:47  Vitals shown include unfiled device data.  Last Pain:  Vitals:   04/13/24 0829  TempSrc:   PainSc: 0-No pain      Patients Stated Pain Goal: 5 (04/13/24 9178)  Complications: No notable events documented.

## 2024-04-13 NOTE — H&P (Signed)
 REFERRING PHYSICIAN: Craig Alan Dines, [place]  PROVIDER: Breckin Zafar Amy BLUSH, MD  MRN: [id number] DOB: Oct 04, 2005 DATE OF ENCOUNTER: 04/08/2024  Subjective  Chief Complaint: new patient (Gallbladder [facility] )   Reason for consult: Amy Cuevas is a 18 y.o. female who is seen today as an office consultation at the request of Dr. Craig for evaluation of new patient (Gallbladder  GI ) .  History of Present Illness Amy Cuevas is a 18 year old female who presents with severe bloating, stomach pain, and constipation.  She has been experiencing severe bloating, stomach pain, nausea, chronic constipation, and vomiting for over a year, with symptoms intensifying since the end of July. The pain is described as a feeling of her stomach being 'extended and bloated' after eating, exacerbated by wearing tight clothing. The pain is diffuse but most intense in the upper abdomen, especially after consuming greasy or fried foods, which also cause nausea, hot flashes, shakiness, dizziness, and pressure in the chest and throat.  She has been on a gastritis diet, consuming mainly grilled or ground chicken, turkey, cooked vegetables, and some fruits, avoiding eating out. Despite dietary changes, her symptoms persist. She has tried various treatments for constipation, including Linzess, Motegrity , and Miralax , with limited success. A colonoscopy and endoscopy clean-out provided temporary relief.  Her bowel movements are infrequent, occurring about once a week, a change from her previous pattern. She has undergone a comprehensive work-up including a CT scan, upper and lower endoscopies, lab work, and a HIDA scan, which revealed a low-functioning gallbladder.  She has a history of seizures in childhood but has not experienced any recently. Current medications include Protonix  for heartburn, which she feels helps when combined with her diet, though it does not completely alleviate her  symptoms. No recent seizures.  Her mom accompanies her.   Review of Systems: A complete review of systems was obtained from the patient. I have reviewed this information and discussed as appropriate with the patient. See HPI as well for other ROS.  ROS  Medical History: Past Medical History: Diagnosis Date Anxiety GERD (gastroesophageal reflux disease) Seizures (CMS/HHS-HCC)  Patient Active Problem List Diagnosis Irritable bowel syndrome with constipation  History reviewed. No pertinent surgical history.  No Known Allergies  Current Outpatient Medications on File Prior to Visit Medication Sig Dispense Refill escitalopram  oxalate (LEXAPRO ) 20 MG tablet Take 20 mg by mouth once daily hydrOXYzine (ATARAX) 25 MG tablet Take 25 mg by mouth 3 (three) times daily as needed for Itching omega 3-dha-epa-fish oil (FISH OIL) 1,200 (144-216) mg Cap Take by mouth pantoprazole  (PROTONIX ) 40 MG DR tablet Take 40 mg by mouth once daily  No current facility-administered medications on file prior to visit.  Family History Problem Relation Age of Onset Stroke Mother Obesity Mother Diabetes Father Hyperlipidemia (Elevated cholesterol) Father High blood pressure (Hypertension) Father Obesity Father   Social History  Tobacco Use Smoking Status Never Smokeless Tobacco Never   Social History  Socioeconomic History Marital status: Single Tobacco Use Smoking status: Never Smokeless tobacco: Never Substance and Sexual Activity Alcohol use: Never Drug use: Never  Social Drivers of Catering Manager Strain: Low Risk (01/13/2024) Received from [facility] Overall Financial Resource Strain (CARDIA) How hard is it for you to pay for the very basics like food, housing, medical care, and heating?: Not hard at all Food Insecurity: No Food Insecurity (01/13/2024) Received from [facility] Hunger Vital Sign Within the past 12 months, you worried that your food would run  out before you got the money to buy more.: Never true Within the past 12 months, the food you bought just didn't last and you didn't have money to get more.: Never true Transportation Needs: No Transportation Needs (01/13/2024) Received from [facility] - Transportation In the past 12 months, has lack of transportation kept you from medical appointments or from getting medications?: No In the past 12 months, has lack of transportation kept you from meetings, work, or from getting things needed for daily living?: No Physical Activity: Sufficiently Active (05/12/2023) Received from [facility] Exercise Vital Sign On average, how many days per week do you engage in moderate to strenuous exercise (like a brisk walk)?: 4 days On average, how many minutes do you engage in exercise at this level?: 40 min Stress: Stress Concern Present (05/12/2023) Received from [facility] of Occupational Health - Occupational Stress Questionnaire Feeling of Stress : To some extent Social Connections: Socially Integrated (05/12/2023) Received from [facility] Social Network How would you rate your social network (family, work, friends)?: Good participation with social networks Housing Stability: Unknown (04/08/2024) Housing Stability Vital Sign Homeless in the Last Year: No  Objective:  Vitals: 04/08/24 1501 BP: 122/80 Pulse: 100 Temp: 36.8 C (98.3 F) SpO2: 99% Weight: 80.7 kg (178 lb) Height: 167.6 cm (5' 6) PainSc: 4 PainLoc: Abdomen  Body mass index is 28.73 kg/m.  PE Chaperone note: No sensitive exam performed  Constitutional: NAD; conversant; no deformities Eyes: Moist conjunctiva; no lid lag; anicteric; PERRL Neck: Trachea midline; no thyromegaly Lungs: Normal respiratory effort; no tactile fremitus CV: RRR; no palpable thrills; no pitting edema GI: Abd soft, nt, nd; no palpable hepatosplenomegaly MSK: Normal gait; no clubbing/cyanosis Psychiatric:  Appropriate affect; alert and oriented x3 Lymphatic: No palpable cervical or axillary lymphadenopathy Skin:no rash  Labs, Imaging and Diagnostic Testing: From March 30, 2024 showed a normal CBC, normal hepatic function panel, normal basic metabolic panel; reviewed comprehensive metabolic panel from August 18 that showed an isolated mildly elevated alkaline phosphatase level of 134  Celiac panel negative  HIDA scan with ejection fraction April 02, 2024 FINDINGS:  Homogenous uptake within the liver. Normal clearance of the blood pool. Appropriate excretion into the biliary system. Activity is visualized in the small bowel by 25 minutes. Activity is visualized in the gallbladder by 15 minutes.  A fatty meal was administered to augment contraction of the gallbladder. The gallbladder contracted minimally with the fatty meal.  Gallbladder ejection fraction measures: 15% Normal value is >35 % for ensure protocol.  Patent cystic duct and bile duct.  Patient reported symptoms during infusion: None.  IMPRESSION: 1. Low gallbladder ejection fraction of 15%. 2. Patent cystic duct and bile duct.  CT abdomen pelvis January 05, 2024  FINDINGS: Lower chest: Clear lung bases. Normal heart size without pericardial or pleural effusion.  Hepatobiliary: Normal liver. Normal gallbladder, without biliary ductal dilatation.  Pancreas: Normal, without mass or ductal dilatation.  Spleen: Normal in size, without focal abnormality.  Adrenals/Urinary Tract: Normal adrenal glands. Normal kidneys, without hydronephrosis. Normal urinary bladder.  Stomach/Bowel: Normal stomach, without wall thickening. Normal colon and terminal ileum. Normal appendix including on coronal image 56.  Normal small bowel.  Vascular/Lymphatic: Normal caliber of the aorta and branch vessels. No abdominopelvic adenopathy.  Reproductive: Normal uterus and adnexa.  Other: No significant free fluid. No free  intraperitoneal air.  Musculoskeletal: Mild loss of intervertebral disc height at the lumbosacral junction.  IMPRESSION: Normal abdominopelvic CT. No explanation for  patient's symptoms.  ED PA note from January 05, 2024  GI PA office note January 12, 2024; March 30, 2024  EGD January 29, 2024  Reviewed colonoscopy from same date as well Assessment and Plan:   Diagnoses and all orders for this visit:  Biliary dyskinesia  Irritable bowel syndrome with constipation    Assessment & Plan Biliary dyskinesia (lazy gallbladder) with upper abdominal pain and bloating Symptoms include severe bloating, upper abdominal pain, nausea, and vomiting, exacerbated by fatty foods. HIDA scan indicates low gallbladder function. Symptoms suggest gallbladder involvement, but not all symptoms may resolve post-cholecystectomy. - Scheduled laparoscopic cholecystectomy. - Discussed surgical procedure, including laparoscopic approach, potential complications, and postoperative care. - Prescribed tramadol for postoperative pain management. - Advised on postoperative care, including pain management with Tylenol  and ibuprofen , and potential for shoulder pain due to gas. - Discussed risks of surgery, including infection, bile leak, and injury to bile duct, with emphasis on low risk of major complications. - Advised on postoperative restrictions, including no heavy lifting for two weeks.  I believe some of the patient's symptoms are consistent with gallbladder disease.  We discussed gallbladder disease. The patient was given agricultural engineer. We discussed non-operative and operative management. We discussed the signs & symptoms of acute cholecystitis  I discussed laparoscopic cholecystectomy in detail. The patient was given educational material as well as diagrams detailing the procedure. We discussed the risks and benefits of a laparoscopic cholecystectomy including, but not limited to bleeding,  infection, injury to surrounding structures such as the intestine or liver, bile leak, retained gallstones, need to convert to an open procedure, prolonged diarrhea, blood clots such as DVT, common bile duct injury, anesthesia risks, and possible need for additional procedures. We discussed the typical post-operative recovery course. I explained that the likelihood of improvement of their symptoms is fair to good - see below  Chronic constipation Infrequent bowel movements, unresponsive to Linzess, Motegrity , and Miralax . Constipation is not related to gallbladder issues. Possible IBS component considered. - Recommended daily Miralax  for constipation management. - Advised on maintaining hydration and dietary modifications. - Discussed potential need for stronger laxatives if Miralax  is ineffective.  This patient encounter took 30 minutes today to perform the following: take history, perform exam, review outside records, interpret imaging, counsel the patient on their diagnosis and document encounter, findings & plan in the EHR  No follow-ups on file.  This note has been created using automated tools and reviewed for accuracy by Franci Oshana MCADAMS Shailee Foots.  Noha Karasik Amy BLUSH, MD General, Minimally Invasive, & Bariatric Surgery     Electronically signed by Cuevas Camellia Daralyn, MD at 04/08/2024 3:50 PM EST

## 2024-04-14 ENCOUNTER — Encounter (HOSPITAL_COMMUNITY): Payer: Self-pay | Admitting: General Surgery

## 2024-04-15 LAB — SURGICAL PATHOLOGY

## 2024-05-02 ENCOUNTER — Other Ambulatory Visit: Payer: Self-pay | Admitting: Internal Medicine

## 2024-05-02 DIAGNOSIS — R1013 Epigastric pain: Secondary | ICD-10-CM

## 2024-05-02 DIAGNOSIS — R112 Nausea with vomiting, unspecified: Secondary | ICD-10-CM

## 2024-05-05 MED ORDER — PRUCALOPRIDE SUCCINATE 1 MG PO TABS: 1.0000 | ORAL_TABLET | Freq: Every day | ORAL | 1 refills | Status: AC

## 2024-05-05 NOTE — Progress Notes (Signed)
° °  PROVIDER:  ERIC DARALYN BLUSH, MD  MRN: [id number] DOB: [birth year] DATE OF ENCOUNTER: 05/05/2024 Interval History:  Surgical hx: 04/13/2024   Laparoscopic Cholecystectomy with near infrared fluorescent cholangiography History of Present Illness Amy Cuevas is a 18 year old female who presents for follow-up after gallbladder surgery.  Approximately three weeks post-cholecystectomy, she experiences partial improvement in symptoms. She tolerates a wider range of foods, adhering to a low-fat diet with lean meats and vegetables. Consumption of a burger and pizza once resulted in mild discomfort, which is significantly better than pre-surgery experiences.  She continues to experience bloating, which she attributes to constipation. Since the surgery, she has struggled with constipation, likely exacerbated by anesthesia and oxycodone  use. She takes a stool softener daily without relief and has tried Miralax , milk of magnesia, and ensures adequate fluid intake and physical activity. Despite these efforts, she has not achieved full bowel movements, only small ones.  She had issues with constipation prior to the surgery, and the current situation has not improved significantly despite various over-the-counter treatments.  A. GALLBLADDER, CHOLECYSTECTOMY:  - Gallbladder with no specific histopathologic changes  Physical Examination:   Physical Exam  There were no vitals taken for this visit.  PE Chaperone note: No sensitive exam performed  Gen: alert, NAD, non-toxic appearing Pupils: equal, no scleral icterus Pulm: symmetric chest rise Abd: soft, nontender, nondistended. Well-healed trocar sites. No cellulitis. No incisional hernia Skin: no rash, no jaundice    Assessment and Plan:   Diagnoses and all orders for this visit:  S/P laparoscopic cholecystectomy     Assessment & Plan Postoperative care following laparoscopic cholecystectomy Three weeks post-laparoscopic  cholecystectomy for biliary dyskinesia. Reports partial improvement in symptoms with increased tolerance to low-fat foods. Experiencing constipation, likely exacerbated by anesthesia and oxycodone  use. Incisions healing well with no signs of infection. Pathology report showed benign gallbladder with no chronic inflammation. Digestive tract still adjusting to functioning without gallbladder, expected to take 6-12 weeks for full adaptation. - Advised to contact GI medicine team for potential prescription medication for constipation. - Advised to apply sunscreen over incisions if traveling to tropical areas to prevent scar accentuation. - No lifting restrictions; can resume normal activities.  Return if symptoms worsen or fail to improve.   The plan was discussed in detail with the patient today, who expressed understanding.  The patient has my contact information, and understands to call me with any additional questions or concerns in the interval.  I would be happy to see the patient back sooner if the need arises.   This note has been created using automated tools and reviewed for accuracy by ERIC MCADAMS WILSON.   Amy Cuevas. Blush, MD FACS General, Minimally Invasive, & Bariatric Surgery

## 2024-05-05 NOTE — Addendum Note (Signed)
 Addended by: Storm Dulski N on: 05/05/2024 04:32 PM   Modules accepted: Orders

## 2024-05-05 NOTE — Telephone Encounter (Signed)
 Patient with constipation - per last office note Tried: Fleets, suppositories, linzess, a prep without any help, tried to increase water, walking after eating, amitiza and now Motegrity . Please advise, thanks.

## 2024-05-25 NOTE — Progress Notes (Unsigned)
 "    05/25/2024 Amy Cuevas [id number] 02-12-2006  Referring provider: Samie Frederick, PA-C Gwen) Primary GI doctor: Dr. Federico  ASSESSMENT AND PLAN:  Nausea, vomiting and abdominal distention with epigastric pain intermittent for the last year Dad with history of GB removal 01/12/2024 celiac negative 01/05/2024 CTAP W unremarkable stomach without wall thickening, normal colon and TI, normal small bowel 9//2025 EGD esophageal mucosal changes, retained gastric fluid antritis, normal duodenum Path negative H. pylori gastritis negative metaplasia reflux esophagitis 04/02/2024 HIDA scan low gallbladder ejection fraction 15% No NSAIDs, alcohol, atopy -continue pantoprazole  40 mg daily -alginate therapy given -Lifestyle changes discussed, avoid NSAIDS, ETOH, hand out given to the patient -consider GES possibly post COVID gastroparesis, gastroparesis diet given - consider SIBO testing or empiric treatment  Abnormal HIDA Ejection fraction 15% 04/02/2024 Status post laparoscopic cholecystectomy 04/13/2024   Constipation with nausea, bloating worse since cholecystectomy 01/05/2024 CTAP W unremarkable stomach without wall thickening, normal colon and TI, normal small bowel 01/29/2024 colonoscopy excellent prep normal TI nonbleeding internal hemorrhoids no specimens collected recall age 83 2025 celiac negative 2025 KUB small to moderate stool burden Acute onset 1 year ago, Worsening in last 2 weeks, BM once a week with pebbles  Tried: Fleets, suppositories, linzess, a prep without any help, tried to increase water, walking after eating, amitiza - repeat bowel purge -trial of Motegrity  2 mg - consider SIBO testing or empiric treatment  Elevated alkaline phosphatase 01/05/2024 CTAP W normal liver normal gallbladder normal bile ducts normal pancreas normal spleen 05/14/2023 alk phos 119, AST 27 ALT 28, total bilirubin 0.2 01/05/2024 alk phos 134, AST 20, ALT 13, total bilirubin 0.4 Calcium  10.6 total protein 8.3, albumin 4.6 previous vitamin D  deficiency 01/12/2024 GGT negative, normal alk phos  PCOS  Elevated sed rate CRP Repeat negative  Fatigue Follow up with PCP Consider sleep study  Patient Care Team: Samie Frederick, PA-C as PCP - General (Physician Assistant)  HISTORY OF PRESENT ILLNESS: 18 y.o. female with a past medical history listed below presents for evaluation of peptic ulcer.   I last saw the patient in the office 04/02/2024  Discussed the use of AI scribe software for clinical note transcription with the patient, who gave verbal consent to proceed.  History of Present Illness          She  reports that she has never smoked. She has never been exposed to tobacco smoke. She has never used smokeless tobacco. She reports that she does not drink alcohol and does not use drugs.  RELEVANT GI HISTORY, IMAGING AND LABS: Results          CBC    Component Value Date/Time   WBC 6.0 03/30/2024 1015   RBC 4.59 03/30/2024 1015   HGB 13.3 03/30/2024 1015   HCT 39.1 03/30/2024 1015   PLT 409.0 (H) 03/30/2024 1015   MCV 85.2 03/30/2024 1015   MCH 28.7 01/05/2024 1448   MCHC 33.9 03/30/2024 1015   RDW 12.8 03/30/2024 1015   LYMPHSABS 1.8 03/30/2024 1015   MONOABS 0.5 03/30/2024 1015   EOSABS 0.3 03/30/2024 1015   BASOSABS 0.0 03/30/2024 1015   Recent Labs    01/05/24 1448 01/12/24 1153 03/30/24 1015  HGB 14.6 13.1 13.3    CMP     Component Value Date/Time   NA 137 03/30/2024 1015   K 3.9 03/30/2024 1015   CL 101 03/30/2024 1015   CO2 30 03/30/2024 1015   GLUCOSE 79 03/30/2024 1015   BUN 14 03/30/2024  1015   CREATININE 0.87 03/30/2024 1015   CREATININE 0.91 08/06/2021 1441   CALCIUM 9.6 03/30/2024 1015   PROT 7.7 03/30/2024 1015   ALBUMIN 4.4 03/30/2024 1015   AST 17 03/30/2024 1015   ALT 13 03/30/2024 1015   ALKPHOS 91 03/30/2024 1015   BILITOT 0.4 03/30/2024 1015   GFRNONAA >60 01/05/2024 1448   GFRAA NOT CALCULATED 12/28/2012 2028       Latest Ref Rng & Units 03/30/2024   10:15 AM 01/12/2024   11:53 AM 01/05/2024    2:48 PM  Hepatic Function  Total Protein 6.0 - 8.3 g/dL 7.7  7.3  8.3   Albumin 3.5 - 5.2 g/dL 4.4  4.2  4.6   AST 0 - 37 U/L 17  14  20    ALT 0 - 35 U/L 13  11  13    Alk Phosphatase 39 - 117 U/L 91  98  134   Total Bilirubin 0.2 - 1.2 mg/dL 0.4  0.4  0.4   Bilirubin, Direct 0.0 - 0.3 mg/dL 0.1         Current Medications:   Current Outpatient Medications (Analgesics):    acetaminophen  (TYLENOL ) 500 MG tablet, Take 2 tablets (1,000 mg total) by mouth every 6 (six) hours.   oxyCODONE  (OXY IR/ROXICODONE ) 5 MG immediate release tablet, Take 1 tablet (5 mg total) by mouth every 6 (six) hours as needed for severe pain (pain score 7-10).  Current Outpatient Medications (Other):    escitalopram  (LEXAPRO ) 20 MG tablet, Take 20 mg by mouth in the morning.   hydrOXYzine (ATARAX/VISTARIL) 25 MG tablet, Take 25 mg by mouth daily as needed for anxiety.   Inositol POWD, Take 1 Scoop by mouth daily. Myo-Inositol & D-Chiro Inositol Supplement Powder   Multiple Vitamin (MULTIVITAMIN) LIQD, Take 30 mLs by mouth in the morning.   Omega-3 Fatty Acids (FISH OIL) 1200 MG CAPS, Take 1 capsule by mouth in the morning.   pantoprazole  (PROTONIX ) 40 MG tablet, TAKE 1 TABLET TWICE A DAY FOR 10 WEEKS THEN 1 TABLET DAILY   polyethylene glycol (MIRALAX  / GLYCOLAX ) 17 g packet, Take 17 g by mouth daily as needed (constipation.).   Prucalopride Succinate  (MOTEGRITY ) 1 MG TABS, Take 1 tablet (1 mg total) by mouth daily.  Medical History:  Past Medical History:  Diagnosis Date   ADHD (attention deficit hyperactivity disorder)    Anxiety    Anxiety disorder    Depression    GERD (gastroesophageal reflux disease)    PCOS (polycystic ovarian syndrome)    Scoliosis    Allergies: No Known Allergies   Surgical History:  She  has a past surgical history that includes Cholecystectomy (N/A, 04/13/2024). Family History:  Her  family history includes Cystic fibrosis in her brother; Diabetes type I in her paternal grandfather; Diabetes type II in her maternal grandmother and paternal grandmother; Endometriosis in her mother; Fibromyalgia in her maternal aunt; Hyperlipidemia in her father; Hypertension in her maternal grandmother and paternal grandmother; Irritable bowel syndrome in her maternal grandmother; Multiple sclerosis in her maternal aunt; Polycystic ovary syndrome in her mother.  REVIEW OF SYSTEMS  : All other systems reviewed and negative except where noted in the History of Present Illness.  PHYSICAL EXAM: There were no vitals taken for this visit. Physical Exam          Alan JONELLE Coombs, PA-C 3:43 PM   "

## 2024-05-28 ENCOUNTER — Telehealth: Payer: Self-pay

## 2024-05-28 ENCOUNTER — Ambulatory Visit: Admitting: Physician Assistant

## 2024-05-28 ENCOUNTER — Encounter: Payer: Self-pay | Admitting: Physician Assistant

## 2024-05-28 VITALS — BP 116/78 | HR 86 | Ht 65.0 in | Wt 181.4 lb

## 2024-05-28 DIAGNOSIS — K5904 Chronic idiopathic constipation: Secondary | ICD-10-CM

## 2024-05-28 DIAGNOSIS — R748 Abnormal levels of other serum enzymes: Secondary | ICD-10-CM | POA: Diagnosis not present

## 2024-05-28 DIAGNOSIS — R112 Nausea with vomiting, unspecified: Secondary | ICD-10-CM | POA: Diagnosis not present

## 2024-05-28 DIAGNOSIS — E559 Vitamin D deficiency, unspecified: Secondary | ICD-10-CM

## 2024-05-28 DIAGNOSIS — K5900 Constipation, unspecified: Secondary | ICD-10-CM | POA: Diagnosis not present

## 2024-05-28 DIAGNOSIS — R1013 Epigastric pain: Secondary | ICD-10-CM | POA: Diagnosis not present

## 2024-05-28 NOTE — Patient Instructions (Addendum)
 _______________________________________________________  If your blood pressure at your visit was 140/90 or greater, please contact your primary care physician to follow up on this.  _______________________________________________________  If you are age 21 or older, your body mass index should be between 23-30. Your Body mass index is 30.18 kg/m. If this is out of the aforementioned range listed, please consider follow up with your Primary Care Provider.  If you are age 12 or younger, your body mass index should be between 19-25. Your Body mass index is 30.18 kg/m. If this is out of the aformentioned range listed, please consider follow up with your Primary Care Provider.   ________________________________________________________  The [facility] providers would like to encourage you to use MYCHART to communicate with providers for non-urgent requests or questions.  Due to long hold times on the telephone, sending your provider a message by [facility] may be a faster and more efficient way to get a response.  Please allow 48 business hours for a response.  Please remember that this is for non-urgent requests.  _______________________________________________________  [facility] is using a team-based approach to care.  Your team is made up of your doctor and two to three APPS. Our APPS (Nurse Practitioners and Physician Assistants) work with your physician to ensure care continuity for you. They are fully qualified to address your health concerns and develop a treatment plan. They communicate directly with your gastroenterologist to care for you. Seeing the Advanced Practice Practitioners on your physician's team can help you by facilitating care more promptly, often allowing for earlier appointments, access to diagnostic testing, procedures, and other specialty referrals.   You have been scheduled for an appointment with Dr. Federico on 08-03-24 at 910am . Please arrive 10 minutes early for  your appointment.  You have been given a testing kit to check for small intestine bacterial overgrowth (SIBO) which is completed by a company named [facility]. Make sure to return your test in the mail using the return mailing label given to you along with the kit. The test order, your demographic and insurance information have all already been sent to the company. [facility] will collect an upfront charge of $109.00 for commercial insurance plans and $229.00 if you are paying cash. The potential remaining total after claim submission and review is $120.00. Make sure to discuss with [facility] PRIOR to having the test to see if they have gotten information from your insurance company as to how much your testing will cost out of pocket, if any. Please contact [facility] at phone number [phone] to get instructions regarding how to perform the test as our office is unable to give specific testing instructions.    Here some information about pelvic floor dysfunction. This may be contributing to some of your symptoms. We could also refer to pelvic floor physical therapy.   Pelvic Floor Dysfunction, Female Pelvic floor dysfunction (PFD) is a condition that results when the group of muscles and connective tissues that support the organs in the pelvis (pelvic floor muscles) do not work well. These muscles and their connections form a sling that supports the colon and bladder. In women, they also support the uterus. PFD causes pelvic floor muscles to be too weak, too tight, or both. In PFD, muscle movements are not coordinated. This may cause bowel or bladder problems. It may also cause pain. What are the causes? This condition may be caused by an injury to the pelvic area or by a weakening of pelvic muscles. This often results from  pregnancy and childbirth or other types of strain. In many cases, the exact cause is not known. What increases the risk? The following factors may make  you more likely to develop this condition: Having chronic bladder tissue inflammation (interstitial cystitis). Being an older person. Being overweight. History of radiation treatment for cancer in the pelvic region. Previous pelvic surgery, such as removal of the uterus (hysterectomy). What are the signs or symptoms? Symptoms of this condition vary and may include: Bladder symptoms, such as: Trouble starting urination and emptying the bladder. Frequent urinary tract infections. Leaking urine when coughing, laughing, or exercising (stress incontinence). Having to pass urine urgently or frequently. Pain when passing urine. Bowel symptoms, such as: Constipation. Urgent or frequent bowel movements. Incomplete bowel movements. Painful bowel movements. Leaking stool or gas. Unexplained genital or rectal pain. Genital or rectal muscle spasms. Low back pain. Other symptoms may include: A heavy, full, or aching feeling in the vagina. A bulge that protrudes into the vagina. Pain during or after sex. How is this diagnosed? This condition may be diagnosed based on: Your symptoms and medical history. A physical exam. During the exam, your health care provider may check your pelvic muscles for tightness, spasm, pain, or weakness. This may include a rectal exam and a pelvic exam. In some cases, you may have diagnostic tests, such as: Electrical muscle function tests. Urine flow testing. X-ray tests of bowel function. Ultrasound of the pelvic organs. How is this treated? Treatment for this condition depends on the symptoms. Treatment options include: Physical therapy. This may include Kegel exercises to help relax or strengthen the pelvic floor muscles. Biofeedback. This type of therapy provides feedback on how tight your pelvic floor muscles are so that you can learn to control them. Internal or external massage therapy. A treatment that involves electrical stimulation of the pelvic floor  muscles to help control pain (transcutaneous electrical nerve stimulation, or TENS). Sound wave therapy (ultrasound) to reduce muscle spasms. Medicines, such as: Muscle relaxants. Bladder control medicines. Surgery to reconstruct or support pelvic floor muscles may be an option if other treatments do not help. Follow these instructions at home: Activity Do your usual activities as told by your health care provider. Ask your health care provider if you should modify any activities. Do pelvic floor strengthening or relaxing exercises at home as told by your physical therapist. Lifestyle Maintain a healthy weight. Eat foods that are high in fiber, such as beans, whole grains, and fresh fruits and vegetables. Limit foods that are high in fat and processed sugars, such as fried or sweet foods. Manage stress with relaxation techniques such as yoga or meditation. General instructions If you have problems with leakage: Use absorbable pads or wear padded underwear. Wash frequently with mild soap. Keep your genital and anal area as clean and dry as possible. Ask your health care provider if you should try a barrier cream to prevent skin irritation. Take warm baths to relieve pelvic muscle tension or spasms. Take over-the-counter and prescription medicines only as told by your health care provider. Keep all follow-up visits. How is this prevented? The cause of PFD is not always known, but there are a few things you can do to reduce the risk of developing this condition, including: Staying at a healthy weight. Getting regular exercise. Managing stress. Contact a health care provider if: Your symptoms are not improving with home care. You have signs or symptoms of PFD that get worse at home. You develop new signs or  symptoms. You have signs of a urinary tract infection, such as: Fever. Chills. Increased urinary frequency. A burning feeling when urinating. You have not had a bowel movement in  3 days (constipation). Summary Pelvic floor dysfunction results when the muscles and connective tissues in your pelvic floor do not work well. These muscles and their connections form a sling that supports your colon and bladder. In women, they also support the uterus. PFD may be caused by an injury to the pelvic area or by a weakening of pelvic muscles. PFD causes pelvic floor muscles to be too weak, too tight, or a combination of both. Symptoms may vary from person to person. In most cases, PFD can be treated with physical therapies and medicines. Surgery may be an option if other treatments do not help. This information is not intended to replace advice given to you by your health care provider. Make sure you discuss any questions you have with your health care provider. Document Revised: 09/20/2020 Document Reviewed: 09/20/2020 Elsevier Patient Education  2022 [employer]  Small intestinal bacterial overgrowth (SIBO) occurs when there is an abnormal increase in the overall bacterial population in the small intestine -- particularly types of bacteria not commonly found in that part of the digestive tract. Small intestinal bacterial overgrowth (SIBO) commonly results when a circumstance -- such as surgery or disease -- slows the passage of food and waste products in the digestive tract, creating a breeding ground for bacteria.  Signs and symptoms of SIBO often include: Loss of appetite Abdominal pain Nausea Bloating An uncomfortable feeling of fullness after eating Diarrhea or constipation, depending on the type of gas produced  What foods trigger SIBO? While foods arent the original cause of SIBO, certain foods do encourage the overgrowth of the wrong bacteria in your small intestine. If youre feeding them their favorite foods, theyre going to grow more, and that will trigger more of your SIBO symptoms. By the same token, you can help reduce the overgrowth by starving the problematic  bacteria of their favorite foods. This strategy has led to a number of proposed SIBO eating plans. The plans vary, and so do individual results. But in general, they tend to recommend limiting carbohydrates.  These include: Sugars and sweeteners. Fruits and starchy vegetables. Dairy products. Grains.  There is a test for this we can do called a breath test, if you are positive we will treat you with an antibiotic to see if it helps.  Your symptoms are very suspicious for this condition, as discussed, we will start you on an antibiotic to see if this helps.

## 2024-05-28 NOTE — Telephone Encounter (Addendum)
 Faxed successfully all 5 pages with cover letter requisition demographics and insurance card to [facility]. Dx code  R14 bloating

## 2024-06-04 ENCOUNTER — Telehealth: Payer: Self-pay | Admitting: Physician Assistant

## 2024-06-04 DIAGNOSIS — K638219 Small intestinal bacterial overgrowth, unspecified: Secondary | ICD-10-CM

## 2024-06-04 MED ORDER — RIFAXIMIN 550 MG PO TABS: 550.0000 mg | ORAL_TABLET | Freq: Three times a day (TID) | ORAL | 0 refills | Status: AC

## 2024-06-04 MED ORDER — NEOMYCIN SULFATE 500 MG PO TABS: 500.0000 mg | ORAL_TABLET | Freq: Two times a day (BID) | ORAL | 0 refills | Status: AC

## 2024-06-04 NOTE — Telephone Encounter (Signed)
 Area diagnostic testing completed 06/02/2024 Increase in hydrogen 32 ppm should be less than 20, methane 9 ppm within the normal range, increasing combined H2 and CH to 41 ppm should be less than 15  Normally would treat with Xifaxan  with neomycin  but insurance does not cover this for SIBO we can try it but likely will have to do Flagyl 250 mg 3 times daily 10 days with neomycin  500 mg twice daily for 7 days since you are having constipation.

## 2024-06-10 ENCOUNTER — Ambulatory Visit: Attending: Physician Assistant | Admitting: Physical Therapy

## 2024-06-10 ENCOUNTER — Encounter: Payer: Self-pay | Admitting: Physical Therapy

## 2024-06-10 ENCOUNTER — Other Ambulatory Visit: Payer: Self-pay

## 2024-06-10 DIAGNOSIS — R102 Pelvic and perineal pain unspecified side: Secondary | ICD-10-CM | POA: Insufficient documentation

## 2024-06-10 DIAGNOSIS — R279 Unspecified lack of coordination: Secondary | ICD-10-CM | POA: Diagnosis present

## 2024-06-10 DIAGNOSIS — M6281 Muscle weakness (generalized): Secondary | ICD-10-CM | POA: Insufficient documentation

## 2024-06-10 DIAGNOSIS — R293 Abnormal posture: Secondary | ICD-10-CM | POA: Insufficient documentation

## 2024-06-10 NOTE — Patient Instructions (Signed)
 Toileting Mechanics 101: Urination  Positioning: sit all the way down on the toilet, feet flat on the floor, trunk relaxed Hovering over the toilet seat can impact your ability to relax the pelvic floor musculature and empty your bladder well  When you initiate urination, try to let the urine flow out naturally rather than pushing down. Pushing can limit the ability of your urethral sphincter to relax and open. Double voiding technique: When you think you are done urinating, try gently pushing to see if there is any remaining urine that can be expelled   You can try the following techniques to see if there is any remaining urine that can be expelled: Rocking back and forth Leaning side to side  Twisting your trunk  Taking deep belly breaths to promote blood flow to pelvic floor musculature  Defecation  Positioning: sit all the way down on the toilet, feet flat on the floor, trunk relaxed You can try using a squatty potty or toilet stool under your feet to change the angle of your rectum in your pelvic floor, making it easier to pass stool with less straining  Avoid straining or breath holding while on the toilet. This causes increased intra-abdominal pressure, which causes increased pressure down through the pelvic floor. We want to avoid this if possible. If you do feel the need to push to pass bowel movements, practice the following technique instead: Take a deep breath in, focusing on inflating your belly like it is a balloon.  Imagine you are fogging up a mirror with your breath as you exhale and gently push down and out through your pelvic floor. This relieves some pressure while you're still able to push the stool out.  You can try the following techniques to see if there is any remaining stool that can be expelled: Rocking back and forth Leaning side to side  Twisting your trunk  Taking deep belly breaths to promote blood flow to pelvic floor musculature

## 2024-06-10 NOTE — Therapy (Signed)
 " OUTPATIENT PHYSICAL THERAPY FEMALE PELVIC EVALUATION   Patient Name: Ronneisha Jett MRN: [id number] DOB:September 19, 2005, 19 y.o., female Today's Date: 06/10/2024  END OF SESSION:  PT End of Session - 06/10/24 1712     Visit Number 1    Number of Visits 10    Date for Recertification  12/08/24    Authorization Type [employer]    PT Start Time 0245    PT Stop Time 0330    PT Time Calculation (min) 45 min    Activity Tolerance Patient tolerated treatment well    Behavior During Therapy WFL for tasks assessed/performed          Past Medical History:  Diagnosis Date   ADHD (attention deficit hyperactivity disorder)    Anxiety    Anxiety disorder    Depression    GERD (gastroesophageal reflux disease)    PCOS (polycystic ovarian syndrome)    Scoliosis    Past Surgical History:  Procedure Laterality Date   CHOLECYSTECTOMY N/A 04/13/2024   Procedure: LAPAROSCOPIC CHOLECYSTECTOMY;  Surgeon: Tanda Locus, MD;  Location: [facility];  Service: General;  Laterality: N/A;   Patient Active Problem List   Diagnosis Date Noted   Vitamin D  deficiency 08/06/2021   Attention deficit hyperactivity disorder (ADHD), combined type 08/23/2020   PCOS (polycystic ovarian syndrome) 06/08/2020   Generalized anxiety disorder with panic attacks 06/08/2020   Abnormal uterine bleeding 05/25/2020   Slow transit constipation 05/25/2020   Adjustment disorder with mixed anxiety and depressed mood 05/25/2020    PCP: Samie Frederick, PA-C  REFERRING PROVIDER: Craig Alan SAUNDERS, PA-C   REFERRING DIAG: [phone] (ICD-10-CM) - Chronic idiopathic constipation  THERAPY DIAG:  Abnormal posture  Muscle weakness (generalized)  Pelvic pain  Unspecified lack of coordination  Rationale for Evaluation and Treatment: Rehabilitation  ONSET DATE: 3 years ago   SUBJECTIVE:                                                                                                                                                                                            SUBJECTIVE STATEMENT: Eval: Pt reports that she just found out she has SIBO - she learned this on Friday. Pt reports that she didn't grow up with constipation, but once she hit 12 she lost her period and she learned she has PCOS. She still struggles to get her period regularly. 3 years ago she started struggling with the constipation. She feels like she has a very time passing bowel movements.  Fluid intake: 4, 40 oz water bottles per day; herbal tea at least 2 times per day   FUNCTIONAL LIMITATIONS: toileting   PERTINENT  HISTORY:  Medications for current condition:  Surgeries: Gallbladder removal in November  Other: PCOS Sexual abuse: No  PAIN:  Are you having pain? No NPRS scale: 6/10 - when she is constipated  Pain location: pelvic floor when constipated   Pain type: aching Pain description: intermittent   Aggravating factors: unsure  Relieving factors: nothing helps   PRECAUTIONS: None  RED FLAGS: None   WEIGHT BEARING RESTRICTIONS: No  FALLS:  Has patient fallen in last 6 months? 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; wishing to go back to work   ACTIVITY LEVEL : usually very active but this was hard over the summer due to sickness; prefers to walk   PLOF: Independent  PATIENT GOALS: poop better   BOWEL MOVEMENT: Pain with bowel movement: Yes - when she is constipated Type of bowel movement:Type (Bristol Stool Scale) 1, Frequency every 3 to 4 days , Strain yes but tries not to, and Splinting no has a squatty potty  Fully empty rectum: No Leakage: No                                                  Caused by:  Bowel urgency: doesn't get a strong urge  Pads: No Fiber supplement/laxative No - tries to get fiber from her food   URINATION: Pain with urination: No - sometimes when she  Fully empty bladder: Yes:                                           Post-void dribble: No Stream: Strong Urgency: Yes   Frequency:during the day within normal limits                                                         Nocturia: No   Leakage: none Pads/briefs: No  INTERCOURSE:  Ability to have vaginal penetration No  Pain with intercourse: none Dryness: No Climax: no Marinoff Scale: 0/3  PREGNANCY: Vaginal deliveries 0 C-section deliveries 0 Currently pregnant No  PROLAPSE: None  OBJECTIVE:  Note: Objective measures were completed at Evaluation unless otherwise noted. PATIENT SURVEYS:  PFIQ-7: 54  COGNITION: Overall cognitive status: Within functional limits for tasks assessed     SENSATION: Light touch: Appears intact  LUMBAR SPECIAL TESTS:  Straight leg raise test: Positive  FUNCTIONAL TESTS:  Single leg stance:  Rt: +   Lt: +  Sit-up test:  Squat: within functional limits  Bed mobility: within functional limits   GAIT: Assistive device utilized: None Comments: mild trendelenburg gait pattern with ambulation   POSTURE: rounded shoulders and forward head  LUMBARAROM/PROM:within normal limits for all motions tested bilaterally with no pain   LOWER EXTREMITY MNF:tpuypw normal limits for all motions tested bilaterally with no pain   LOWER EXTREMITY MMT: 4/5 bilateral knees and hips grossly with no pain   PALPATION:  General: no tenderness to palpation of gluteals or ischial tuberosities   Pelvic Alignment: within normal limits   Abdominal: abdominal bracing at rest   Diastasis: No Distortion: No  Breathing: apical  Scar  tissue: No Active Straight Leg Raise: + bilaterally                 External Perineal Exam: anal wink reflex intact, moisture levels intact                             Internal Pelvic Floor: Pt demonstrates high tone in the rectum with a lack of coordination when contracting vs relaxing the musculature. No pain during exam.  Patient confirms identification and approves PT to assess internal pelvic floor and treatment Yes No  emotional/communication barriers or cognitive limitation. Patient is motivated to learn. Patient understands and agrees with treatment goals and plan. PT explains patient will be examined in standing, sitting, and lying down to see how their muscles and joints work. When they are ready, they will be asked to remove their underwear so PT can examine their perineum. The patient is also given the option of providing their own chaperone as one is not provided in our facility. The patient also has the right and is explained the right to defer or refuse any part of the evaluation or treatment including the internal exam. With the patient's consent, PT will use one gloved finger to gently assess the muscles of the pelvic floor, seeing how well it contracts and relaxes and if there is muscle symmetry. After, the patient will get dressed and PT and patient will discuss exam findings and plan of care. PT and patient discuss plan of care, schedule, attendance policy and HEP activities. All internal or external pelvic floor assessments and/or treatments are completed with proper hand hygiene and gloves hands. If needed gloves are changed with hand hygiene during patient care time.  PELVIC MMT:   MMT eval  Vaginal   Internal Anal Sphincter 3/5  External Anal Sphincter 3/5  Puborectalis 5/5  (Blank rows = not tested)        TONE: High in rectum   PROLAPSE: N/A   TODAY'S TREATMENT:                                                                                                                              DATE:   EVAL 06/10/24: Examination completed, findings reviewed, pt educated on POC, HEP, and self care. Pt motivated to participate in PT and agreeable to attempt recommendations.   Exercises - Supine Pelvic Floor Contraction  - 1 x daily - 7 x weekly - 3 sets - 10 reps  Patient Education - Get To Know Your Pelvic Floor- Female - High-Fiber Diet to Support Pelvic Health - Bowel Emptying  Techniques  PATIENT EDUCATION:  Education details: relative anatomy and the connection between the diaphragm and pelvic floor  Person educated: Patient Education method: Explanation, Demonstration, Tactile cues, Verbal cues, and Handouts Education comprehension: verbalized understanding, returned demonstration, verbal cues required, tactile cues required, and needs further education  HOME EXERCISE PROGRAM: Access Code:  [email or web address] URL: [email or web address] Date: 06/10/2024 Prepared by: Celena Domino  Exercises - Supine Pelvic Floor Contraction  - 1 x daily - 7 x weekly - 3 sets - 10 reps  Patient Education - Get To Know Your Pelvic Floor- Female - High-Fiber Diet to Support Pelvic Health - Bowel Emptying Techniques  ASSESSMENT:  CLINICAL IMPRESSION: Patient is a 19 y.o. female  who was seen today for physical therapy evaluation and treatment for chronic constipation. She has been dealing with constipation and difficulty with passing bowel movements. She feels like she is able to get very close to passing bowel movements, but it takes a while for her to pass. These bowel movements are always type 1 and hard in nature. She carries a diagnosis of PCOS and recently learned she has SIBO. Patient demonstrates abdominal tension that is affecting her ability to take a diaphragmatic breath in supine. Mild hip weakness noted during objective examination. Pt fully consented to today's rectal examination, and Pt demonstrates high tone in the rectum with a lack of coordination when contracting vs relaxing the musculature. No pain during exam. Overall, pt tolerated session well and Pt would benefit from additional PT to further address constipation and pelvic floor weakness.    OBJECTIVE IMPAIRMENTS: decreased coordination, decreased endurance, decreased mobility, decreased ROM, decreased strength, and pain.   ACTIVITY LIMITATIONS: continence and toileting  PARTICIPATION LIMITATIONS:  driving, community activity, and occupation  PERSONAL FACTORS: Age, Past/current experiences, and 1 comorbidity: PCOS are also affecting patient's functional outcome.   REHAB POTENTIAL: Good  CLINICAL DECISION MAKING: Stable/uncomplicated  EVALUATION COMPLEXITY: Low   GOALS: Goals reviewed with patient? Yes  SHORT TERM GOALS: Target date: 07/08/2024  Pt will be independent with HEP.  Baseline: Goal status: INITIAL  2.  Pt will be independent with diaphragmatic breathing and down training activities in order to improve pelvic floor relaxation. Baseline:  Goal status: INITIAL  3.  Pt will be independent with use of squatty potty, relaxed toileting mechanics, and improved bowel movement techniques in order to increase ease of bowel movements and complete evacuation.  Baseline:  Goal status: INITIAL  4.  Pt will be independent with the knack, urge suppression technique, and double voiding in order to improve bladder habits and decrease urinary incontinence.  Baseline:  Goal status: INITIAL  LONG TERM GOALS: Target date: 12/08/2024  Pt will be independent with advanced HEP.  Baseline:  Goal status: INITIAL  2.  Pt to demonstrate improved coordination of pelvic floor and breathing mechanics with 10# squat with appropriate synergistic patterns to decrease pain and leakage at least 75% of the time for improved ability to complete a 30 minute workout with strain at pelvic floor and symptoms.   Baseline:  Goal status: INITIAL  3.  Pt will report her BMs are complete due to improved bowel habits and evacuation techniques.  Baseline:  Goal status: INITIAL  4.  Pt will report 3-4 BMs per week due to improved muscle tone and coordination with BMs.  Baseline:  Goal status: INITIAL  PLAN:  PT FREQUENCY: 1-2x/week  PT DURATION: 6 months  PLANNED INTERVENTIONS: 97110-Therapeutic exercises, 97530- Therapeutic activity, 97112- Neuromuscular re-education, 97535- Self Care, [id number]-  Manual therapy, Patient/Family education, Taping, Joint mobilization, Spinal mobilization, Scar mobilization, Cryotherapy, Moist heat, and Biofeedback  PLAN FOR NEXT SESSION: continued downtraining of hips and back; continued pelvic floor training in seated; toileting mechanics if needed; abdominal bowel massage   Celena JAYSON Domino, PT 06/10/2024, 5:12 PM  "

## 2024-07-16 ENCOUNTER — Ambulatory Visit: Admitting: Physical Therapy

## 2024-07-21 ENCOUNTER — Ambulatory Visit: Admitting: Physical Therapy

## 2024-07-27 ENCOUNTER — Ambulatory Visit: Admitting: Physical Therapy

## 2024-08-03 ENCOUNTER — Ambulatory Visit: Admitting: Internal Medicine

## 2024-08-05 ENCOUNTER — Ambulatory Visit: Admitting: Physical Therapy
